# Patient Record
Sex: Female | Born: 1980
Health system: Southern US, Community
[De-identification: ages and names within clinical notes are randomized; demographics above are authoritative.]

## PROBLEM LIST (undated history)

## (undated) ENCOUNTER — Inpatient Hospital Stay (HOSPITAL_COMMUNITY): Payer: Self-pay

## (undated) DIAGNOSIS — A630 Anogenital (venereal) warts: Secondary | ICD-10-CM

## (undated) DIAGNOSIS — J45909 Unspecified asthma, uncomplicated: Secondary | ICD-10-CM

## (undated) DIAGNOSIS — O321XX0 Maternal care for breech presentation, not applicable or unspecified: Secondary | ICD-10-CM

## (undated) DIAGNOSIS — K589 Irritable bowel syndrome without diarrhea: Secondary | ICD-10-CM

## (undated) DIAGNOSIS — R87629 Unspecified abnormal cytological findings in specimens from vagina: Secondary | ICD-10-CM

## (undated) DIAGNOSIS — O24410 Gestational diabetes mellitus in pregnancy, diet controlled: Secondary | ICD-10-CM

## (undated) DIAGNOSIS — O24419 Gestational diabetes mellitus in pregnancy, unspecified control: Secondary | ICD-10-CM

## (undated) DIAGNOSIS — F419 Anxiety disorder, unspecified: Secondary | ICD-10-CM

## (undated) DIAGNOSIS — L309 Dermatitis, unspecified: Secondary | ICD-10-CM

## (undated) DIAGNOSIS — D219 Benign neoplasm of connective and other soft tissue, unspecified: Secondary | ICD-10-CM

## (undated) HISTORY — PX: WISDOM TOOTH EXTRACTION: SHX21

## (undated) HISTORY — DX: Anogenital (venereal) warts: A63.0

---

## 2011-09-24 ENCOUNTER — Emergency Department (HOSPITAL_COMMUNITY)
Admission: EM | Admit: 2011-09-24 | Discharge: 2011-09-24 | Disposition: A | Discharge status: Home / Self Care | Payer: PRIVATE HEALTH INSURANCE | Attending: Emergency Medicine | Admitting: Emergency Medicine

## 2011-09-24 DIAGNOSIS — X58XXXA Exposure to other specified factors, initial encounter: Secondary | ICD-10-CM | POA: Insufficient documentation

## 2011-09-24 DIAGNOSIS — R42 Dizziness and giddiness: Secondary | ICD-10-CM | POA: Insufficient documentation

## 2011-09-24 DIAGNOSIS — R5381 Other malaise: Secondary | ICD-10-CM | POA: Insufficient documentation

## 2011-09-24 DIAGNOSIS — R5383 Other fatigue: Secondary | ICD-10-CM | POA: Insufficient documentation

## 2011-09-24 DIAGNOSIS — T783XXA Angioneurotic edema, initial encounter: Secondary | ICD-10-CM | POA: Insufficient documentation

## 2011-12-29 DIAGNOSIS — A630 Anogenital (venereal) warts: Secondary | ICD-10-CM

## 2011-12-29 HISTORY — DX: Anogenital (venereal) warts: A63.0

## 2012-05-19 ENCOUNTER — Ambulatory Visit (INDEPENDENT_AMBULATORY_CARE_PROVIDER_SITE_OTHER): Payer: PRIVATE HEALTH INSURANCE | Admitting: Obstetrics & Gynecology

## 2012-05-19 ENCOUNTER — Encounter: Payer: Self-pay | Admitting: Obstetrics & Gynecology

## 2012-05-19 VITALS — BP 104/64 | HR 65 | Ht 59.0 in | Wt 130.0 lb

## 2012-05-19 DIAGNOSIS — Z3009 Encounter for other general counseling and advice on contraception: Secondary | ICD-10-CM

## 2012-05-19 DIAGNOSIS — Z30011 Encounter for initial prescription of contraceptive pills: Secondary | ICD-10-CM

## 2012-05-19 MED ORDER — NORETHIN-ETH ESTRAD-FE BIPHAS 1 MG-10 MCG / 10 MCG PO TABS: 1.0000 | ORAL_TABLET | Freq: Every day | ORAL | Status: DC

## 2012-05-19 NOTE — Patient Instructions (Signed)
Return to clinic for any scheduled appointments or for any gynecologic concerns as needed.   

## 2012-05-19 NOTE — Progress Notes (Signed)
Patient is here for birth control consult.  She was taking ortho tricyclen lo but it was costing her 100.00 a pack.

## 2012-05-19 NOTE — Progress Notes (Signed)
Ana Davis is a 31 y.o. G0 here today to discuss contraception. She denies any abnormal bleeding, discharge or other concerns. She is sexually active, was using Ortho Tricyclen Lo but it is too expensive.  Has tried regular OCPs but gets a lot of side effects; wants a low estrogen pill.  Reviewed all forms of reversible birth control options available including abstinence; over the counter methods; oral contraceptive pill, patch, or ring; Depo-Provera injection; Nexplanon; Mirena and Paragard IUDs. Risks and benefits reviewed. Questions were answered. She is interested in the Lo Loestrin pills; samples were given to the patient.    Normal pap smear last year as per patient.  Return to clinic as needed  Total counseling time: 30 minutes.

## 2012-08-18 ENCOUNTER — Ambulatory Visit: Payer: PRIVATE HEALTH INSURANCE | Admitting: Obstetrics & Gynecology

## 2013-08-18 ENCOUNTER — Ambulatory Visit (INDEPENDENT_AMBULATORY_CARE_PROVIDER_SITE_OTHER): Payer: BC Managed Care – PPO | Admitting: Obstetrics & Gynecology

## 2013-08-18 ENCOUNTER — Encounter: Payer: Self-pay | Admitting: Obstetrics & Gynecology

## 2013-08-18 VITALS — BP 130/79 | HR 60 | Ht 59.0 in | Wt 123.0 lb

## 2013-08-18 DIAGNOSIS — Z0000 Encounter for general adult medical examination without abnormal findings: Secondary | ICD-10-CM

## 2013-08-18 DIAGNOSIS — Z30011 Encounter for initial prescription of contraceptive pills: Secondary | ICD-10-CM

## 2013-08-18 DIAGNOSIS — Z124 Encounter for screening for malignant neoplasm of cervix: Secondary | ICD-10-CM

## 2013-08-18 DIAGNOSIS — Z01419 Encounter for gynecological examination (general) (routine) without abnormal findings: Secondary | ICD-10-CM

## 2013-08-18 DIAGNOSIS — Z3009 Encounter for other general counseling and advice on contraception: Secondary | ICD-10-CM

## 2013-08-18 DIAGNOSIS — Z113 Encounter for screening for infections with a predominantly sexual mode of transmission: Secondary | ICD-10-CM

## 2013-08-18 DIAGNOSIS — Z1151 Encounter for screening for human papillomavirus (HPV): Secondary | ICD-10-CM

## 2013-08-18 MED ORDER — NORETHIN-ETH ESTRAD-FE BIPHAS 1 MG-10 MCG / 10 MCG PO TABS: 1.0000 | ORAL_TABLET | Freq: Every day | ORAL | Status: DC

## 2013-08-18 NOTE — Progress Notes (Signed)
Patient ID: Ana Davis, female   DOB: 12-02-1981, 32 y.o.   MRN: [id number] Subjective:    Ana Davis is a 32 y.o. female who presents for an annual exam. The patient has no complaints today. The patient is sexually active. GYN screening history: last pap: was normal. The patient wears seatbelts: yes. The patient participates in regular exercise: yes. Has the patient ever been transfused or tattooed?: no. The patient reports that there is not domestic violence in her life.   Menstrual History: OB History   Grav Para Term Preterm Abortions TAB SAB Ect Mult Living                  Menarche age: 37 Coitarche: 61   Patient's last menstrual period was 08/09/2013.    The following portions of the patient's history were reviewed and updated as appropriate: allergies, current medications, past family history, past medical history, past social history, past surgical history and problem list.  Review of Systems A comprehensive review of systems was negative. 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 Single, monogamous for 2 years, lives with her parents currently. She did not have Gardasil. She uses condoms for birth control. She wants to restart OCPs.   Objective:    BP 130/79  Pulse 60  Ht 4\' 11"  (1.499 m)  Wt 123 lb (55.792 kg)  BMI 24.83 kg/m2  LMP 08/09/2013  General Appearance:    Alert, cooperative, no distress, appears stated age  Head:    Normocephalic, without obvious abnormality, atraumatic  Eyes:    PERRL, conjunctiva/corneas clear, EOM's intact, fundi    benign, both eyes  Ears:    Normal TM's and external ear canals, both ears  Nose:   Nares normal, septum midline, mucosa normal, no drainage    or sinus tenderness  Throat:   Lips, mucosa, and tongue normal; teeth and gums normal  Neck:   Supple, symmetrical, trachea midline, no adenopathy;    thyroid:  no enlargement/tenderness/nodules; no carotid   bruit or JVD  Back:     Symmetric, no  curvature, ROM normal, no CVA tenderness  Lungs:     Clear to auscultation bilaterally, respirations unlabored  Chest Wall:    No tenderness or deformity   Heart:    Regular rate and rhythm, S1 and S2 normal, no murmur, rub   or gallop  Breast Exam:    No tenderness, masses, or nipple abnormality  Abdomen:     Soft, non-tender, bowel sounds active all four quadrants,    no masses, no organomegaly  Genitalia:    Normal female without lesion, discharge or tenderness, cervix very anteverted, NSSR, NT, normal adnexal exam     Extremities:   Extremities normal, atraumatic, no cyanosis or edema  Pulses:   2+ and symmetric all extremities  Skin:   Skin color, texture, turgor normal, no rashes or lesions  Lymph nodes:   Cervical, supraclavicular, and axillary nodes normal  Neurologic:   CNII-XII intact, normal strength, sensation and reflexes    throughout  .    Assessment:    Healthy female exam.    Plan:     Breast self exam technique reviewed and patient encouraged to perform self-exam monthly. Thin prep Pap smear. with cotesting, cervical cultures

## 2013-08-21 ENCOUNTER — Telehealth: Payer: Self-pay | Admitting: *Deleted

## 2013-08-21 DIAGNOSIS — Z30011 Encounter for initial prescription of contraceptive pills: Secondary | ICD-10-CM

## 2013-08-21 DIAGNOSIS — Z3009 Encounter for other general counseling and advice on contraception: Secondary | ICD-10-CM

## 2013-08-21 MED ORDER — NORETHIN-ETH ESTRAD-FE BIPHAS 1 MG-10 MCG / 10 MCG PO TABS: 1.0000 | ORAL_TABLET | Freq: Every day | ORAL | Status: DC

## 2013-08-21 NOTE — Telephone Encounter (Signed)
rx resent to pharmacy, they said they did not receive the first transmission.

## 2013-08-21 NOTE — Progress Notes (Signed)
Patient called to say that Friday Ana Davis in [place] did not have her prescription.  She will check back with them today and see if they have it and call us back if for some reason the transmission did not go through.

## 2013-08-22 MED ORDER — NORGESTIMATE-ETH ESTRADIOL 0.25-35 MG-MCG PO TABS: 1.0000 | ORAL_TABLET | Freq: Every day | ORAL | Status: DC

## 2013-08-22 MED ORDER — NORETHIN-ETH ESTRAD-FE BIPHAS 1 MG-10 MCG / 10 MCG PO TABS: 1.0000 | ORAL_TABLET | Freq: Every day | ORAL | Status: DC

## 2013-08-22 NOTE — Addendum Note (Signed)
Addended by: Barbara Cower on: 08/22/2013 11:21 AM   Modules accepted: Orders

## 2013-08-22 NOTE — Progress Notes (Signed)
Patient called back again and the ocp we called in is not covered by her insurance and she would like something that is generic that they will cover.  Rx changed to orthocyclen with refills called to pharmacy.

## 2013-08-22 NOTE — Addendum Note (Signed)
Addended by: Barbara Cower on: 08/22/2013 03:02 PM   Modules accepted: Orders

## 2014-09-13 ENCOUNTER — Telehealth: Payer: Self-pay

## 2014-09-13 NOTE — Telephone Encounter (Signed)
Patient called regarding her birth control. She is out and has no refills. We gave her a pack with no refills and made her an appointment to come in for her annual on 10/02/14 and get a new prescription.

## 2014-10-02 ENCOUNTER — Encounter: Payer: Self-pay | Admitting: Family Medicine

## 2014-10-02 ENCOUNTER — Ambulatory Visit (INDEPENDENT_AMBULATORY_CARE_PROVIDER_SITE_OTHER): Payer: 59 | Admitting: Family Medicine

## 2014-10-02 VITALS — BP 126/75 | HR 75 | Ht 59.0 in | Wt 130.0 lb

## 2014-10-02 DIAGNOSIS — Z01419 Encounter for gynecological examination (general) (routine) without abnormal findings: Secondary | ICD-10-CM

## 2014-10-02 DIAGNOSIS — Z3009 Encounter for other general counseling and advice on contraception: Secondary | ICD-10-CM

## 2014-10-02 DIAGNOSIS — Z309 Encounter for contraceptive management, unspecified: Secondary | ICD-10-CM

## 2014-10-02 DIAGNOSIS — Z124 Encounter for screening for malignant neoplasm of cervix: Secondary | ICD-10-CM

## 2014-10-02 DIAGNOSIS — Z1151 Encounter for screening for human papillomavirus (HPV): Secondary | ICD-10-CM

## 2014-10-02 DIAGNOSIS — Z30011 Encounter for initial prescription of contraceptive pills: Secondary | ICD-10-CM

## 2014-10-02 DIAGNOSIS — Z0000 Encounter for general adult medical examination without abnormal findings: Secondary | ICD-10-CM

## 2014-10-02 LAB — CBC
HEMATOCRIT: 36.4 % (ref 36.0–46.0)
Hemoglobin: 12.2 g/dL (ref 12.0–15.0)
MCH: 31 pg (ref 26.0–34.0)
MCHC: 33.5 g/dL (ref 30.0–36.0)
MCV: 92.6 fL (ref 78.0–100.0)
Platelets: 360 10*3/uL (ref 150–400)
RBC: 3.93 MIL/uL (ref 3.87–5.11)
RDW: 13.1 % (ref 11.5–15.5)
WBC: 5.6 10*3/uL (ref 4.0–10.5)

## 2014-10-02 MED ORDER — NORETHIN-ETH ESTRAD-FE BIPHAS 1 MG-10 MCG / 10 MCG PO TABS
1.0000 | ORAL_TABLET | Freq: Every day | ORAL | Status: DC
Start: 2014-10-02 — End: 2015-09-13

## 2014-10-02 NOTE — Progress Notes (Signed)
Subjective:     Ana Davis is a 33 y.o. female and is here for a comprehensive physical exam. The patient reports no problems. Recently married. On Lo Loestrin and continuing that---may decide to try to conceive next year.  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   History   Social History  . Marital Status: Single    Spouse Name: N/A    Number of Children: N/A  . Years of Education: N/A   Occupational History  . Not on file.   Social History Main Topics  . Smoking status: Never Smoker   . Smokeless tobacco: Never Used  . Alcohol Use: Yes     Comment: social  . Drug Use: No  . Sexual Activity: Yes    Partners: Male    Birth Control/ Protection: OCP   Other Topics Concern  . Not on file   Social History Narrative  . No narrative on file   Health Maintenance  Topic Date Due  . Tetanus/tdap  05/18/1999  . Influenza Vaccine  07/28/2014  . Pap Smear  08/18/2016    The following portions of the patient's history were reviewed and updated as appropriate: allergies, current medications, past family history, past medical history, past social history, past surgical history and problem list.  Review of Systems A comprehensive review of systems was negative.   Objective:    BP 126/75  Pulse 75  Ht 4\' 11"  (1.499 m)  Wt 130 lb (58.968 kg)  BMI 26.24 kg/m2  LMP 09/12/2014 General appearance: alert and cooperative Head: Normocephalic, without obvious abnormality, atraumatic Neck: no adenopathy, supple, symmetrical, trachea midline and thyroid not enlarged, symmetric, no tenderness/mass/nodules Lungs: clear to auscultation bilaterally Breasts: normal appearance, no masses or tenderness Heart: regular rate and rhythm, S1, S2 normal, no murmur, click, rub or gallop Abdomen: soft, non-tender; bowel sounds normal; no masses,  no organomegaly Pelvic: cervix normal in appearance, external genitalia normal, no adnexal masses or tenderness, no cervical motion tenderness, uterus normal  size, shape, and consistency and vagina normal without discharge Extremities: extremities normal, atraumatic, no cyanosis or edema Pulses: 2+ and symmetric Skin: Skin color, texture, turgor normal. No rashes or lesions Lymph nodes: Cervical, supraclavicular, and axillary nodes normal. Neurologic: Grossly normal    Assessment:    Healthy female exam.      Plan:      Problem List Items Addressed This Visit   None    Visit Diagnoses   Encounter for routine gynecological examination    -  Primary    Relevant Orders       CBC       Comprehensive metabolic panel       TSH       Lipid panel    OCP (oral contraceptive pills) initiation        Relevant Medications       Norethindrone-Ethinyl Estradiol-Fe Biphas (LO LOESTRIN FE) 1 MG-10 MCG / 10 MCG tablet    Encounter for other general counseling or advice on contraception        Relevant Medications       Norethindrone-Ethinyl Estradiol-Fe Biphas (LO LOESTRIN FE) 1 MG-10 MCG / 10 MCG tablet    Annual physical exam        Relevant Medications       Norethindrone-Ethinyl Estradiol-Fe Biphas (LO LOESTRIN FE) 1 MG-10 MCG / 10 MCG tablet    Screening for malignant neoplasm of cervix        Relevant Orders  Cytology - PAP       See After Visit Summary for Counseling Recommendations

## 2014-10-02 NOTE — Patient Instructions (Signed)
Preparing for Pregnancy Before trying to become pregnant, make an appointment with your health care provider (preconception care). The goal is to help you have a healthy, safe pregnancy. At your first appointment, your health care provider will:   Do a complete physical exam, including a Pap test.  Take a complete medical history.  Give you advice and help you resolve any problems. PRECONCEPTION CHECKLIST Here is a list of the basics to cover with your health care provider at your preconception visit:  Medical history.  Tell your health care provider about any diseases you have had. Many diseases can affect your pregnancy.  Include your partner's medical history and family history.  Make sure you have been tested for sexually transmitted infections (STIs). These can affect your pregnancy. In some cases, they can be passed to your baby. Tell your health care provider about any history of STIs.  Make sure your health care provider knows about any previous problems you have had with conception or pregnancy.  Tell your health care provider about any medicine you take. This includes herbal supplements and over-the-counter medicines.  Make sure all your immunizations are up to date. You may need to make additional appointments.  Ask your health care provider if you need any vaccinations or if there are any you should avoid.  Diet.  It is especially important to eat a healthy, balanced diet with the right nutrients when you are pregnant.  Ask your health care provider to help you get to a healthy weight before pregnancy.  If you are overweight, you are at higher risk for certain complications. These include high blood pressure, diabetes, and preterm birth.  If you are underweight, you are more likely to have a low-birth-weight baby.  Lifestyle.  Tell your health care provider about lifestyle factors such as alcohol use, drug use, or smoking.  Describe any harmful substances you may  be exposed to at work or home. These can include chemicals, pesticides, and radiation.  Mental health.  Let your health care provider know if you have been feeling depressed or anxious.  Let your health care provider know if you have a history of substance abuse.  Let your health care provider know if you do not feel safe at home. HOME INSTRUCTIONS TO PREPARE FOR PREGNANCY Follow your health care provider's advice and instructions.   Keep an accurate record of your menstrual periods. This makes it easier for your health care provider to determine your baby's due date.  Begin taking prenatal vitamins and folic acid supplements daily. Take them as directed by your health care provider.  Eat a balanced diet. Get help from a nutrition counselor if you have questions or need help.  Get regular exercise. Try to be active for at least 30 minutes a day most days of the week.  Quit smoking, if you smoke.  Do not drink alcohol.  Do not take illegal drugs.  Get medical problems, such as diabetes or high blood pressure, under control.  If you have diabetes, make sure you do the following:  Have good blood sugar control. If you have type 1 diabetes, use multiple daily doses of insulin. Do not use split-dose or premixed insulin.  Have an eye exam by a qualified eye care professional trained in caring for people with diabetes.  Get evaluated by your health care provider for cardiovascular disease.  Get to a healthy weight. If you are overweight or obese, reduce your weight with the help of a qualified health   professional such as a registered dietitian. Ask your health care provider what the right weight range is for you. HOW DO I KNOW I AM PREGNANT? You may be pregnant if you have been sexually active and you miss your period. Symptoms of early pregnancy include:   Mild cramping.  Very light vaginal bleeding (spotting).  Feeling unusually tired.  Morning sickness. If you have any of  these symptoms, take a home pregnancy test. These tests look for a hormone called human chorionic gonadotropin (hCG) in your urine. Your body begins to make this hormone during early pregnancy. These tests are very accurate. Wait until at least the first day you miss your period to take one. If you get a positive result, call your health care provider to make appointments for prenatal care. WHAT SHOULD I DO IF I BECOME PREGNANT?  Make an appointment with your health care provider by week 12 of your pregnancy at the latest.  Do not smoke. Smoking can be harmful to your baby.  Do not drink alcoholic beverages. Alcohol is related to a number of birth defects.  Avoid toxic odors and chemicals.  You may continue to have sexual intercourse if it does not cause pain or other problems, such as vaginal bleeding. Document Released: 11/26/2008 Document Revised: 04/30/2014 Document Reviewed: 11/20/2013 [employer] Patient Information 2015 [employer]. This information is not intended to replace advice given to you by your health care provider. Make sure you discuss any questions you have with your health care provider. Preventive Care for Adults A healthy lifestyle and preventive care can promote health and wellness. Preventive health guidelines for women include the following key practices.  A routine yearly physical is a good way to check with your health care provider about your health and preventive screening. It is a chance to share any concerns and updates on your health and to receive a thorough exam.  Visit your dentist for a routine exam and preventive care every 6 months. Brush your teeth twice a day and floss once a day. Good oral hygiene prevents tooth decay and gum disease.  The frequency of eye exams is based on your age, health, family medical history, use of contact lenses, and other factors. Follow your health care provider's recommendations for frequency of eye exams.  Eat a healthy  diet. Foods like vegetables, fruits, whole grains, low-fat dairy products, and lean protein foods contain the nutrients you need without too many calories. Decrease your intake of foods high in solid fats, added sugars, and salt. Eat the right amount of calories for you.Get information about a proper diet from your health care provider, if necessary.  Regular physical exercise is one of the most important things you can do for your health. Most adults should get at least 150 minutes of moderate-intensity exercise (any activity that increases your heart rate and causes you to sweat) each week. In addition, most adults need muscle-strengthening exercises on 2 or more days a week.  Maintain a healthy weight. The body mass index (BMI) is a screening tool to identify possible weight problems. It provides an estimate of body fat based on height and weight. Your health care provider can find your BMI and can help you achieve or maintain a healthy weight.For adults 20 years and older:  A BMI below 18.5 is considered underweight.  A BMI of 18.5 to 24.9 is normal.  A BMI of 25 to 29.9 is considered overweight.  A BMI of 30 and above is considered obese.    Maintain normal blood lipids and cholesterol levels by exercising and minimizing your intake of saturated fat. Eat a balanced diet with plenty of fruit and vegetables. Blood tests for lipids and cholesterol should begin at age 20 and be repeated every 5 years. If your lipid or cholesterol levels are high, you are over 50, or you are at high risk for heart disease, you may need your cholesterol levels checked more frequently.Ongoing high lipid and cholesterol levels should be treated with medicines if diet and exercise are not working.  If you smoke, find out from your health care provider how to quit. If you do not use tobacco, do not start.  Lung cancer screening is recommended for adults aged 55-80 years who are at high risk for developing lung cancer  because of a history of smoking. A yearly low-dose CT scan of the lungs is recommended for people who have at least a 30-pack-year history of smoking and are a current smoker or have quit within the past 15 years. A pack year of smoking is smoking an average of 1 pack of cigarettes a day for 1 year (for example: 1 pack a day for 30 years or 2 packs a day for 15 years). Yearly screening should continue until the smoker has stopped smoking for at least 15 years. Yearly screening should be stopped for people who develop a health problem that would prevent them from having lung cancer treatment.  If you are pregnant, do not drink alcohol. If you are breastfeeding, be very cautious about drinking alcohol. If you are not pregnant and choose to drink alcohol, do not have more than 1 drink per day. One drink is considered to be 12 ounces (355 mL) of beer, 5 ounces (148 mL) of wine, or 1.5 ounces (44 mL) of liquor.  Avoid use of street drugs. Do not share needles with anyone. Ask for help if you need support or instructions about stopping the use of drugs.  High blood pressure causes heart disease and increases the risk of stroke. Your blood pressure should be checked at least every 1 to 2 years. Ongoing high blood pressure should be treated with medicines if weight loss and exercise do not work.  If you are 55-79 years old, ask your health care provider if you should take aspirin to prevent strokes.  Diabetes screening involves taking a blood sample to check your fasting blood sugar level. This should be done once every 3 years, after age 45, if you are within normal weight and without risk factors for diabetes. Testing should be considered at a younger age or be carried out more frequently if you are overweight and have at least 1 risk factor for diabetes.  Breast cancer screening is essential preventive care for women. You should practice "breast self-awareness." This means understanding the normal appearance  and feel of your breasts and may include breast self-examination. Any changes detected, no matter how small, should be reported to a health care provider. Women in their 20s and 30s should have a clinical breast exam (CBE) by a health care provider as part of a regular health exam every 1 to 3 years. After age 40, women should have a CBE every year. Starting at age 40, women should consider having a mammogram (breast X-ray test) every year. Women who have a family history of breast cancer should talk to their health care provider about genetic screening. Women at a high risk of breast cancer should talk to their health care providers   about having an MRI and a mammogram every year.  Breast cancer gene (BRCA)-related cancer risk assessment is recommended for women who have family members with BRCA-related cancers. BRCA-related cancers include breast, ovarian, tubal, and peritoneal cancers. Having family members with these cancers may be associated with an increased risk for harmful changes (mutations) in the breast cancer genes BRCA1 and BRCA2. Results of the assessment will determine the need for genetic counseling and BRCA1 and BRCA2 testing.  Routine pelvic exams to screen for cancer are no longer recommended for nonpregnant women who are considered low risk for cancer of the pelvic organs (ovaries, uterus, and vagina) and who do not have symptoms. Ask your health care provider if a screening pelvic exam is right for you.  If you have had past treatment for cervical cancer or a condition that could lead to cancer, you need Pap tests and screening for cancer for at least 20 years after your treatment. If Pap tests have been discontinued, your risk factors (such as having a new sexual partner) need to be reassessed to determine if screening should be resumed. Some women have medical problems that increase the chance of getting cervical cancer. In these cases, your health care provider may recommend more  frequent screening and Pap tests.  The HPV test is an additional test that may be used for cervical cancer screening. The HPV test looks for the virus that can cause the cell changes on the cervix. The cells collected during the Pap test can be tested for HPV. The HPV test could be used to screen women aged 54 years and older, and should be used in women of any age who have unclear Pap test results. After the age of 39, women should have HPV testing at the same frequency as a Pap test.  Colorectal cancer can be detected and often prevented. Most routine colorectal cancer screening begins at the age of 36 years and continues through age 74 years. However, your health care provider may recommend screening at an earlier age if you have risk factors for colon cancer. On a yearly basis, your health care provider may provide home test kits to check for hidden blood in the stool. Use of a small camera at the end of a tube, to directly examine the colon (sigmoidoscopy or colonoscopy), can detect the earliest forms of colorectal cancer. Talk to your health care provider about this at age 35, when routine screening begins. Direct exam of the colon should be repeated every 5-10 years through age 66 years, unless early forms of pre-cancerous polyps or small growths are found.  People who are at an increased risk for hepatitis B should be screened for this virus. You are considered at high risk for hepatitis B if:  You were born in a country where hepatitis B occurs often. Talk with your health care provider about which countries are considered high risk.  Your parents were born in a high-risk country and you have not received a shot to protect against hepatitis B (hepatitis B vaccine).  You have HIV or AIDS.  You use needles to inject street drugs.  You live with, or have sex with, someone who has hepatitis B.  You get hemodialysis treatment.  You take certain medicines for conditions like cancer, organ  transplantation, and autoimmune conditions.  Hepatitis C blood testing is recommended for all people born from [birth year] through 1966 and any individual with known risks for hepatitis C.  Practice safe sex. Use condoms and avoid  high-risk sexual practices to reduce the spread of sexually transmitted infections (STIs). STIs include gonorrhea, chlamydia, syphilis, trichomonas, herpes, HPV, and human immunodeficiency virus (HIV). Herpes, HIV, and HPV are viral illnesses that have no cure. They can result in disability, cancer, and death.  You should be screened for sexually transmitted illnesses (STIs) including gonorrhea and chlamydia if:  You are sexually active and are younger than 24 years.  You are older than 24 years and your health care provider tells you that you are at risk for this type of infection.  Your sexual activity has changed since you were last screened and you are at an increased risk for chlamydia or gonorrhea. Ask your health care provider if you are at risk.  If you are at risk of being infected with HIV, it is recommended that you take a prescription medicine daily to prevent HIV infection. This is called preexposure prophylaxis (PrEP). You are considered at risk if:  You are a heterosexual woman, are sexually active, and are at increased risk for HIV infection.  You take drugs by injection.  You are sexually active with a partner who has HIV.  Talk with your health care provider about whether you are at high risk of being infected with HIV. If you choose to begin PrEP, you should first be tested for HIV. You should then be tested every 3 months for as long as you are taking PrEP.  Osteoporosis is a disease in which the bones lose minerals and strength with aging. This can result in serious bone fractures or breaks. The risk of osteoporosis can be identified using a bone density scan. Women ages 65 years and over and women at risk for fractures or osteoporosis should discuss  screening with their health care providers. Ask your health care provider whether you should take a calcium supplement or vitamin D to reduce the rate of osteoporosis.  Menopause can be associated with physical symptoms and risks. Hormone replacement therapy is available to decrease symptoms and risks. You should talk to your health care provider about whether hormone replacement therapy is right for you.  Use sunscreen. Apply sunscreen liberally and repeatedly throughout the day. You should seek shade when your shadow is shorter than you. Protect yourself by wearing long sleeves, pants, a wide-brimmed hat, and sunglasses year round, whenever you are outdoors.  Once a month, do a whole body skin exam, using a mirror to look at the skin on your back. Tell your health care provider of new moles, moles that have irregular borders, moles that are larger than a pencil eraser, or moles that have changed in shape or color.  Stay current with required vaccines (immunizations).  Influenza vaccine. All adults should be immunized every year.  Tetanus, diphtheria, and acellular pertussis (Td, Tdap) vaccine. Pregnant women should receive 1 dose of Tdap vaccine during each pregnancy. The dose should be obtained regardless of the length of time since the last dose. Immunization is preferred during the 27th-36th week of gestation. An adult who has not previously received Tdap or who does not know her vaccine status should receive 1 dose of Tdap. This initial dose should be followed by tetanus and diphtheria toxoids (Td) booster doses every 10 years. Adults with an unknown or incomplete history of completing a 3-dose immunization series with Td-containing vaccines should begin or complete a primary immunization series including a Tdap dose. Adults should receive a Td booster every 10 years.  Varicella vaccine. An adult without evidence of immunity   to varicella should receive 2 doses or a second dose if she has  previously received 1 dose. Pregnant females who do not have evidence of immunity should receive the first dose after pregnancy. This first dose should be obtained before leaving the health care facility. The second dose should be obtained 4-8 weeks after the first dose.  Human papillomavirus (HPV) vaccine. Females aged 13-26 years who have not received the vaccine previously should obtain the 3-dose series. The vaccine is not recommended for use in pregnant females. However, pregnancy testing is not needed before receiving a dose. If a female is found to be pregnant after receiving a dose, no treatment is needed. In that case, the remaining doses should be delayed until after the pregnancy. Immunization is recommended for any person with an immunocompromised condition through the age of 26 years if she did not get any or all doses earlier. During the 3-dose series, the second dose should be obtained 4-8 weeks after the first dose. The third dose should be obtained 24 weeks after the first dose and 16 weeks after the second dose.  Zoster vaccine. One dose is recommended for adults aged 60 years or older unless certain conditions are present.  Measles, mumps, and rubella (MMR) vaccine. Adults born before 1957 generally are considered immune to measles and mumps. Adults born in 1957 or later should have 1 or more doses of MMR vaccine unless there is a contraindication to the vaccine or there is laboratory evidence of immunity to each of the three diseases. A routine second dose of MMR vaccine should be obtained at least 28 days after the first dose for students attending postsecondary schools, health care workers, or international travelers. People who received inactivated measles vaccine or an unknown type of measles vaccine during 1963-1967 should receive 2 doses of MMR vaccine. People who received inactivated mumps vaccine or an unknown type of mumps vaccine before 1979 and are at high risk for mumps  infection should consider immunization with 2 doses of MMR vaccine. For females of childbearing age, rubella immunity should be determined. If there is no evidence of immunity, females who are not pregnant should be vaccinated. If there is no evidence of immunity, females who are pregnant should delay immunization until after pregnancy. Unvaccinated health care workers born before 1957 who lack laboratory evidence of measles, mumps, or rubella immunity or laboratory confirmation of disease should consider measles and mumps immunization with 2 doses of MMR vaccine or rubella immunization with 1 dose of MMR vaccine.  Pneumococcal 13-valent conjugate (PCV13) vaccine. When indicated, a person who is uncertain of her immunization history and has no record of immunization should receive the PCV13 vaccine. An adult aged 19 years or older who has certain medical conditions and has not been previously immunized should receive 1 dose of PCV13 vaccine. This PCV13 should be followed with a dose of pneumococcal polysaccharide (PPSV23) vaccine. The PPSV23 vaccine dose should be obtained at least 8 weeks after the dose of PCV13 vaccine. An adult aged 19 years or older who has certain medical conditions and previously received 1 or more doses of PPSV23 vaccine should receive 1 dose of PCV13. The PCV13 vaccine dose should be obtained 1 or more years after the last PPSV23 vaccine dose.  Pneumococcal polysaccharide (PPSV23) vaccine. When PCV13 is also indicated, PCV13 should be obtained first. All adults aged 65 years and older should be immunized. An adult younger than age 65 years who has certain medical conditions should be immunized.   Any person who resides in a nursing home or long-term care facility should be immunized. An adult smoker should be immunized. People with an immunocompromised condition and certain other conditions should receive both PCV13 and PPSV23 vaccines. People with human immunodeficiency virus (HIV)  infection should be immunized as soon as possible after diagnosis. Immunization during chemotherapy or radiation therapy should be avoided. Routine use of PPSV23 vaccine is not recommended for American Indians, [place] Natives, or people younger than 65 years unless there are medical conditions that require PPSV23 vaccine. When indicated, people who have unknown immunization and have no record of immunization should receive PPSV23 vaccine. One-time revaccination 5 years after the first dose of PPSV23 is recommended for people aged 19-64 years who have chronic kidney failure, nephrotic syndrome, asplenia, or immunocompromised conditions. People who received 1-2 doses of PPSV23 before age 65 years should receive another dose of PPSV23 vaccine at age 65 years or later if at least 5 years have passed since the previous dose. Doses of PPSV23 are not needed for people immunized with PPSV23 at or after age 65 years.  Meningococcal vaccine. Adults with asplenia or persistent complement component deficiencies should receive 2 doses of quadrivalent meningococcal conjugate (MenACWY-D) vaccine. The doses should be obtained at least 2 months apart. Microbiologists working with certain meningococcal bacteria, military recruits, people at risk during an outbreak, and people who travel to or live in countries with a high rate of meningitis should be immunized. A first-year college student up through age 21 years who is living in a residence hall should receive a dose if she did not receive a dose on or after her 16th birthday. Adults who have certain high-risk conditions should receive one or more doses of vaccine.  Hepatitis A vaccine. Adults who wish to be protected from this disease, have certain high-risk conditions, work with hepatitis A-infected animals, work in hepatitis A research labs, or travel to or work in countries with a high rate of hepatitis A should be immunized. Adults who were previously unvaccinated and who  anticipate close contact with an international adoptee during the first 60 days after arrival in the [place] from a country with a high rate of hepatitis A should be immunized.  Hepatitis B vaccine. Adults who wish to be protected from this disease, have certain high-risk conditions, may be exposed to blood or other infectious body fluids, are household contacts or sex partners of hepatitis B positive people, are clients or workers in certain care facilities, or travel to or work in countries with a high rate of hepatitis B should be immunized.  Haemophilus influenzae type b (Hib) vaccine. A previously unvaccinated person with asplenia or sickle cell disease or having a scheduled splenectomy should receive 1 dose of Hib vaccine. Regardless of previous immunization, a recipient of a hematopoietic stem cell transplant should receive a 3-dose series 6-12 months after her successful transplant. Hib vaccine is not recommended for adults with HIV infection. Preventive Services / Frequency Ages 19 to 39 years  Blood pressure check.** / Every 1 to 2 years.  Lipid and cholesterol check.** / Every 5 years beginning at age 20.  Clinical breast exam.** / Every 3 years for women in their 20s and 30s.  BRCA-related cancer risk assessment.** / For women who have family members with a BRCA-related cancer (breast, ovarian, tubal, or peritoneal cancers).  Pap test.** / Every 2 years from ages 21 through 29. Every 3 years starting at age 30 through age 65 or 70   with a history of 3 consecutive normal Pap tests.  HPV screening.** / Every 3 years from ages 30 through ages 65 to 70 with a history of 3 consecutive normal Pap tests.  Hepatitis C blood test.** / For any individual with known risks for hepatitis C.  Skin self-exam. / Monthly.  Influenza vaccine. / Every year.  Tetanus, diphtheria, and acellular pertussis (Tdap, Td) vaccine.** / Consult your health care provider. Pregnant women should receive 1  dose of Tdap vaccine during each pregnancy. 1 dose of Td every 10 years.  Varicella vaccine.** / Consult your health care provider. Pregnant females who do not have evidence of immunity should receive the first dose after pregnancy.  HPV vaccine. / 3 doses over 6 months, if 26 and younger. The vaccine is not recommended for use in pregnant females. However, pregnancy testing is not needed before receiving a dose.  Measles, mumps, rubella (MMR) vaccine.** / You need at least 1 dose of MMR if you were born in 1957 or later. You may also need a 2nd dose. For females of childbearing age, rubella immunity should be determined. If there is no evidence of immunity, females who are not pregnant should be vaccinated. If there is no evidence of immunity, females who are pregnant should delay immunization until after pregnancy.  Pneumococcal 13-valent conjugate (PCV13) vaccine.** / Consult your health care provider.  Pneumococcal polysaccharide (PPSV23) vaccine.** / 1 to 2 doses if you smoke cigarettes or if you have certain conditions.  Meningococcal vaccine.** / 1 dose if you are age 19 to 21 years and a first-year college student living in a residence hall, or have one of several medical conditions, you need to get vaccinated against meningococcal disease. You may also need additional booster doses.  Hepatitis A vaccine.** / Consult your health care provider.  Hepatitis B vaccine.** / Consult your health care provider.  Haemophilus influenzae type b (Hib) vaccine.** / Consult your health care provider. Ages 40 to 64 years  Blood pressure check.** / Every 1 to 2 years.  Lipid and cholesterol check.** / Every 5 years beginning at age 20 years.  Lung cancer screening. / Every year if you are aged 55-80 years and have a 30-pack-year history of smoking and currently smoke or have quit within the past 15 years. Yearly screening is stopped once you have quit smoking for at least 15 years or develop a  health problem that would prevent you from having lung cancer treatment.  Clinical breast exam.** / Every year after age 40 years.  BRCA-related cancer risk assessment.** / For women who have family members with a BRCA-related cancer (breast, ovarian, tubal, or peritoneal cancers).  Mammogram.** / Every year beginning at age 40 years and continuing for as long as you are in good health. Consult with your health care provider.  Pap test.** / Every 3 years starting at age 30 years through age 65 or 70 years with a history of 3 consecutive normal Pap tests.  HPV screening.** / Every 3 years from ages 30 years through ages 65 to 70 years with a history of 3 consecutive normal Pap tests.  Fecal occult blood test (FOBT) of stool. / Every year beginning at age 50 years and continuing until age 75 years. You may not need to do this test if you get a colonoscopy every 10 years.  Flexible sigmoidoscopy or colonoscopy.** / Every 5 years for a flexible sigmoidoscopy or every 10 years for a colonoscopy beginning at age 50 years   and continuing until age 75 years.  Hepatitis C blood test.** / For all people born from 1945 through 1965 and any individual with known risks for hepatitis C.  Skin self-exam. / Monthly.  Influenza vaccine. / Every year.  Tetanus, diphtheria, and acellular pertussis (Tdap/Td) vaccine.** / Consult your health care provider. Pregnant women should receive 1 dose of Tdap vaccine during each pregnancy. 1 dose of Td every 10 years.  Varicella vaccine.** / Consult your health care provider. Pregnant females who do not have evidence of immunity should receive the first dose after pregnancy.  Zoster vaccine.** / 1 dose for adults aged 60 years or older.  Measles, mumps, rubella (MMR) vaccine.** / You need at least 1 dose of MMR if you were born in 1957 or later. You may also need a 2nd dose. For females of childbearing age, rubella immunity should be determined. If there is no  evidence of immunity, females who are not pregnant should be vaccinated. If there is no evidence of immunity, females who are pregnant should delay immunization until after pregnancy.  Pneumococcal 13-valent conjugate (PCV13) vaccine.** / Consult your health care provider.  Pneumococcal polysaccharide (PPSV23) vaccine.** / 1 to 2 doses if you smoke cigarettes or if you have certain conditions.  Meningococcal vaccine.** / Consult your health care provider.  Hepatitis A vaccine.** / Consult your health care provider.  Hepatitis B vaccine.** / Consult your health care provider.  Haemophilus influenzae type b (Hib) vaccine.** / Consult your health care provider. Ages 65 years and over  Blood pressure check.** / Every 1 to 2 years.  Lipid and cholesterol check.** / Every 5 years beginning at age 20 years.  Lung cancer screening. / Every year if you are aged 55-80 years and have a 30-pack-year history of smoking and currently smoke or have quit within the past 15 years. Yearly screening is stopped once you have quit smoking for at least 15 years or develop a health problem that would prevent you from having lung cancer treatment.  Clinical breast exam.** / Every year after age 40 years.  BRCA-related cancer risk assessment.** / For women who have family members with a BRCA-related cancer (breast, ovarian, tubal, or peritoneal cancers).  Mammogram.** / Every year beginning at age 40 years and continuing for as long as you are in good health. Consult with your health care provider.  Pap test.** / Every 3 years starting at age 30 years through age 65 or 70 years with 3 consecutive normal Pap tests. Testing can be stopped between 65 and 70 years with 3 consecutive normal Pap tests and no abnormal Pap or HPV tests in the past 10 years.  HPV screening.** / Every 3 years from ages 30 years through ages 65 or 70 years with a history of 3 consecutive normal Pap tests. Testing can be stopped between 65  and 70 years with 3 consecutive normal Pap tests and no abnormal Pap or HPV tests in the past 10 years.  Fecal occult blood test (FOBT) of stool. / Every year beginning at age 50 years and continuing until age 75 years. You may not need to do this test if you get a colonoscopy every 10 years.  Flexible sigmoidoscopy or colonoscopy.** / Every 5 years for a flexible sigmoidoscopy or every 10 years for a colonoscopy beginning at age 50 years and continuing until age 75 years.  Hepatitis C blood test.** / For all people born from 1945 through 1965 and any individual with known risks   for hepatitis C.  Osteoporosis screening.** / A one-time screening for women ages 65 years and over and women at risk for fractures or osteoporosis.  Skin self-exam. / Monthly.  Influenza vaccine. / Every year.  Tetanus, diphtheria, and acellular pertussis (Tdap/Td) vaccine.** / 1 dose of Td every 10 years.  Varicella vaccine.** / Consult your health care provider.  Zoster vaccine.** / 1 dose for adults aged 60 years or older.  Pneumococcal 13-valent conjugate (PCV13) vaccine.** / Consult your health care provider.  Pneumococcal polysaccharide (PPSV23) vaccine.** / 1 dose for all adults aged 65 years and older.  Meningococcal vaccine.** / Consult your health care provider.  Hepatitis A vaccine.** / Consult your health care provider.  Hepatitis B vaccine.** / Consult your health care provider.  Haemophilus influenzae type b (Hib) vaccine.** / Consult your health care provider. ** Family history and personal history of risk and conditions may change your health care provider's recommendations. Document Released: 02/09/2002 Document Revised: 04/30/2014 Document Reviewed: 05/11/2011 [employer] Patient Information 2015 [employer]. This information is not intended to replace advice given to you by your health care provider. Make sure you discuss any questions you have with your health care provider.  

## 2014-10-03 LAB — COMPREHENSIVE METABOLIC PANEL
ALK PHOS: 46 U/L (ref 39–117)
ALT: 11 U/L (ref 0–35)
AST: 13 U/L (ref 0–37)
Albumin: 3.6 g/dL (ref 3.5–5.2)
BUN: 16 mg/dL (ref 6–23)
CALCIUM: 8.7 mg/dL (ref 8.4–10.5)
CHLORIDE: 105 meq/L (ref 96–112)
CO2: 27 mEq/L (ref 19–32)
CREATININE: 0.8 mg/dL (ref 0.50–1.10)
Glucose, Bld: 100 mg/dL — ABNORMAL HIGH (ref 70–99)
Potassium: 3.8 mEq/L (ref 3.5–5.3)
Sodium: 139 mEq/L (ref 135–145)
Total Bilirubin: 0.3 mg/dL (ref 0.2–1.2)
Total Protein: 6.5 g/dL (ref 6.0–8.3)

## 2014-10-03 LAB — LIPID PANEL
CHOLESTEROL: 123 mg/dL (ref 0–200)
HDL: 52 mg/dL (ref >39)
LDL Cholesterol: 52 mg/dL (ref 0–99)
Total CHOL/HDL Ratio: 2.4 Ratio
Triglycerides: 93 mg/dL (ref <150)
VLDL: 19 mg/dL (ref 0–40)

## 2014-10-03 LAB — TSH: TSH: 0.885 u[IU]/mL (ref 0.350–4.500)

## 2014-10-03 LAB — CYTOLOGY - PAP

## 2014-10-04 ENCOUNTER — Encounter: Payer: Self-pay | Admitting: Family Medicine

## 2014-10-04 DIAGNOSIS — R87810 Cervical high risk human papillomavirus (HPV) DNA test positive: Secondary | ICD-10-CM | POA: Insufficient documentation

## 2015-05-30 ENCOUNTER — Emergency Department (HOSPITAL_COMMUNITY)
Admission: EM | Admit: 2015-05-30 | Discharge: 2015-05-30 | Disposition: A | Discharge status: Home / Self Care | Payer: 59 | Attending: Emergency Medicine | Admitting: Emergency Medicine

## 2015-05-30 ENCOUNTER — Encounter (HOSPITAL_COMMUNITY): Payer: Self-pay | Admitting: *Deleted

## 2015-05-30 DIAGNOSIS — J45901 Unspecified asthma with (acute) exacerbation: Secondary | ICD-10-CM | POA: Diagnosis not present

## 2015-05-30 DIAGNOSIS — Z79899 Other long term (current) drug therapy: Secondary | ICD-10-CM | POA: Diagnosis not present

## 2015-05-30 DIAGNOSIS — J705 Respiratory conditions due to smoke inhalation: Secondary | ICD-10-CM | POA: Insufficient documentation

## 2015-05-30 DIAGNOSIS — Z8619 Personal history of other infectious and parasitic diseases: Secondary | ICD-10-CM | POA: Insufficient documentation

## 2015-05-30 DIAGNOSIS — R0602 Shortness of breath: Secondary | ICD-10-CM | POA: Diagnosis present

## 2015-05-30 DIAGNOSIS — T59811A Toxic effect of smoke, accidental (unintentional), initial encounter: Secondary | ICD-10-CM

## 2015-05-30 HISTORY — DX: Unspecified asthma, uncomplicated: J45.909

## 2015-05-30 LAB — CARBOXYHEMOGLOBIN
Carboxyhemoglobin: 1.1 % (ref 0.5–1.5)
Methemoglobin: 1.3 % (ref 0.0–1.5)
O2 Saturation: 99 %
Total hemoglobin: 11.6 g/dL — ABNORMAL LOW (ref 12.0–16.0)

## 2015-05-30 MED ORDER — ALBUTEROL SULFATE HFA 108 (90 BASE) MCG/ACT IN AERS
1.0000 | INHALATION_SPRAY | Freq: Once | RESPIRATORY_TRACT | Status: AC
  Administered 2015-05-30: 1 via RESPIRATORY_TRACT
  Filled 2015-05-30: qty 6.7

## 2015-05-30 MED ORDER — ALBUTEROL SULFATE (2.5 MG/3ML) 0.083% IN NEBU
5.0000 mg | INHALATION_SOLUTION | Freq: Once | RESPIRATORY_TRACT | Status: AC
  Administered 2015-05-30: 5 mg via RESPIRATORY_TRACT
  Filled 2015-05-30: qty 6
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
## 2015-08-29 HISTORY — PX: COLONOSCOPY: SHX174

## 2015-09-13 ENCOUNTER — Other Ambulatory Visit: Payer: Self-pay | Admitting: Family Medicine

## 2015-09-13 NOTE — Telephone Encounter (Signed)
Pt called and asked for a refill on birth control. Pt will sched appt for AE

## 2015-10-09 ENCOUNTER — Ambulatory Visit (INDEPENDENT_AMBULATORY_CARE_PROVIDER_SITE_OTHER): Payer: 59 | Admitting: Obstetrics & Gynecology

## 2015-10-09 ENCOUNTER — Encounter: Payer: Self-pay | Admitting: Obstetrics & Gynecology

## 2015-10-09 VITALS — BP 102/51 | HR 81 | Resp 18 | Ht 59.0 in | Wt 117.0 lb

## 2015-10-09 DIAGNOSIS — Z124 Encounter for screening for malignant neoplasm of cervix: Secondary | ICD-10-CM | POA: Diagnosis not present

## 2015-10-09 DIAGNOSIS — D251 Intramural leiomyoma of uterus: Secondary | ICD-10-CM

## 2015-10-09 DIAGNOSIS — Z01419 Encounter for gynecological examination (general) (routine) without abnormal findings: Secondary | ICD-10-CM

## 2015-10-09 DIAGNOSIS — Z1151 Encounter for screening for human papillomavirus (HPV): Secondary | ICD-10-CM

## 2015-10-09 DIAGNOSIS — Z0000 Encounter for general adult medical examination without abnormal findings: Secondary | ICD-10-CM
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
## 2015-10-10 LAB — CYTOLOGY - PAP

## 2015-10-15 ENCOUNTER — Ambulatory Visit (HOSPITAL_COMMUNITY)
Admission: RE | Admit: 2015-10-15 | Discharge: 2015-10-15 | Disposition: A | Discharge status: Home / Self Care | Payer: 59 | Source: Ambulatory Visit | Attending: Obstetrics & Gynecology | Admitting: Obstetrics & Gynecology

## 2015-10-15 DIAGNOSIS — D251 Intramural leiomyoma of uterus: Secondary | ICD-10-CM | POA: Insufficient documentation

## 2015-10-15 DIAGNOSIS — N852 Hypertrophy of uterus: Secondary | ICD-10-CM | POA: Diagnosis not present

## 2015-11-06 ENCOUNTER — Ambulatory Visit (INDEPENDENT_AMBULATORY_CARE_PROVIDER_SITE_OTHER): Payer: 59 | Admitting: Obstetrics & Gynecology

## 2015-11-06 ENCOUNTER — Encounter: Payer: Self-pay | Admitting: Obstetrics & Gynecology

## 2015-11-06 VITALS — BP 121/80 | HR 75 | Wt 120.0 lb

## 2015-11-06 DIAGNOSIS — D259 Leiomyoma of uterus, unspecified: Secondary | ICD-10-CM | POA: Diagnosis not present

## 2015-11-06 DIAGNOSIS — D219 Benign neoplasm of connective and other soft tissue, unspecified: Secondary | ICD-10-CM | POA: Insufficient documentation

## 2015-11-06 NOTE — Patient Instructions (Signed)
Uterine Fibroids Uterine fibroids are tissue masses (tumors) that can develop in the womb (uterus). They are also called leiomyomas. This type of tumor is not cancerous (benign) and does not spread to other parts of the body outside of the pelvic area, which is between the hip bones. Occasionally, fibroids may develop in the fallopian tubes, in the cervix, or on the support structures (ligaments) that surround the uterus. You can have one or many fibroids. Fibroids can vary in size, weight, and where they grow in the uterus. Some can become quite large. Most fibroids do not require medical treatment. CAUSES A fibroid can develop when a single uterine cell keeps growing (replicating). Most cells in the human body have a control mechanism that keeps them from replicating without control. SIGNS AND SYMPTOMS Symptoms may include:   Heavy bleeding during your period.  Bleeding or spotting between periods.  Pelvic pain and pressure.  Bladder problems, such as needing to urinate more often (urinary frequency) or urgently.  Inability to reproduce offspring (infertility).  Miscarriages. DIAGNOSIS Uterine fibroids are diagnosed through a physical exam. Your health care provider may feel the lumpy tumors during a pelvic exam. Ultrasonography and an MRI may be done to determine the size, location, and number of fibroids. TREATMENT Treatment may include:  Watchful waiting. This involves getting the fibroid checked by your health care provider to see if it grows or shrinks. Follow your health care provider's recommendations for how often to have this checked.  Hormone medicines. These can be taken by mouth or given through an intrauterine device (IUD).  Surgery.  Removing the fibroids (myomectomy) or the uterus (hysterectomy).  Removing blood supply to the fibroids (uterine artery embolization). If fibroids interfere with your fertility and you want to become pregnant, your health care provider  may recommend having the fibroids removed.  HOME CARE INSTRUCTIONS  Keep all follow-up visits as directed by your health care provider. This is important.  Take medicines only as directed by your health care provider.  If you were prescribed a hormone treatment, take the hormone medicines exactly as directed.  Do not take aspirin, because it can cause bleeding.  Ask your health care provider about taking iron pills and increasing the amount of dark green, leafy vegetables in your diet. These actions can help to boost your blood iron levels, which may be affected by heavy menstrual bleeding.  Pay close attention to your period and tell your health care provider about any changes, such as:  Increased blood flow that requires you to use more pads or tampons than usual per month.  A change in the number of days that your period lasts per month.  A change in symptoms that are associated with your period, such as abdominal cramping or back pain. SEEK MEDICAL CARE IF:  You have pelvic pain, back pain, or abdominal cramps that cannot be controlled with medicines.  You have an increase in bleeding between and during periods.  You soak tampons or pads in a half hour or less.  You feel lightheaded, extra tired, or weak. SEEK IMMEDIATE MEDICAL CARE IF:  You faint.  You have a sudden increase in pelvic pain.   This information is not intended to replace advice given to you by your health care provider. Make sure you discuss any questions you have with your health care provider.   Document Released: 12/11/2000 Document Revised: 01/04/2015 Document Reviewed: 06/12/2014 [employer] Interactive Patient Education 2016 [employer]  

## 2015-11-06 NOTE — Progress Notes (Signed)
CLINIC ENCOUNTER NOTE  History:  34 y.o. G0P0000 here today for follow up on pelvic ultrasound results for evaluation of fibroid uterus. She denies any abnormal vaginal discharge, bleeding, pelvic pain or other concerns.  Reports increased urinary frequency for several months which she attributes to mass effects of the fibroids on her bladder. No dysuria, fevers or other concerns.  Past Medical History  Diagnosis Date  . Genital warts 2013    tx with aldara generic  . Asthma     Past Surgical History  Procedure Laterality Date  . Colonoscopy  08/2015    The following portions of the patient's history were reviewed and updated as appropriate: allergies, current medications, past family history, past medical history, past social history, past surgical history and problem list.   Health Maintenance:  Normal pap and negative HRHPV on 10/09/15.   Review of Systems:  Pertinent items noted in HPI and remainder of comprehensive ROS otherwise negative.  Objective:  Physical Exam BP 121/80 mmHg  Pulse 75  Wt 120 lb (54.432 kg)  LMP 09/12/2015 CONSTITUTIONAL: Well-developed, well-nourished female in no acute distress.  HENT:  Normocephalic, atraumatic. External right and left ear normal. Oropharynx is clear and moist EYES: Conjunctivae and EOM are normal. Pupils are equal, round, and reactive to light. No scleral icterus.  NECK: Normal range of motion, supple, no masses SKIN: Skin is warm and dry. No rash noted. Not diaphoretic. No erythema. No pallor. [place]: Alert and oriented to person, place, and time. Normal reflexes, muscle tone coordination. No cranial nerve deficit noted. PSYCHIATRIC: Normal mood and affect. Normal behavior. Normal judgment and thought content. CARDIOVASCULAR: Normal heart rate noted RESPIRATORY: Effort and breath sounds normal, no problems with respiration noted ABDOMEN: No distention noted.   PELVIC: Deferred MUSCULOSKELETAL: Normal range of motion. No  edema noted.  Labs and Imaging US Transvaginal Non-ob  10/15/2015  CLINICAL DATA:  Patient with enlarged uterus. Evaluate for fibroids. EXAM: TRANSABDOMINAL AND TRANSVAGINAL ULTRASOUND OF PELVIS TECHNIQUE: Both transabdominal and transvaginal ultrasound examinations of the pelvis were performed. Transabdominal technique was performed for global imaging of the pelvis including uterus, ovaries, adnexal regions, and pelvic cul-de-sac. It was necessary to proceed with endovaginal exam following the transabdominal exam to visualize the endometrium and adnexal structures. COMPARISON:  None FINDINGS: Uterus Measurements: 10.5 x 6.3 x 0.7 cm. Anteverted. There multiple large fibroids. There is a 7.3 x 5.9 x 6.2 cm intramural fibroid within the right uterine body, a 4.1 x 3.7 x 4.2 cm intramural fibroid within the left aspect uterine body. Additionally there is a 3.4 x 3.7 x 3.2 cm intramural fibroid within the left uterine body. Endometrium Thickness: 10 mm. Difficult to visualize given multiple adjacent large fibroids. Small amount of fluid in the endometrial canal. Right ovary Measurements: 3.4 x 1.9 x 1.8 cm. Normal appearance/no adnexal mass. Left ovary Measurements: 3.4 x 3.1 x 1.9 cm. Normal appearance/no adnexal mass. Other findings No free fluid. IMPRESSION: Enlarged fibroid uterus. Endometrium measures 10 mm and is difficult to visualize given multiple fibroids. Small amount of fluid in the endometrial canal. The ovaries are poorly assessed due to multiple large uterine fibroids. Electronically Signed   By: Lovey Newcomer M.D.   On: 10/15/2015 09:21   US Pelvis Complete  10/15/2015  CLINICAL DATA:  Patient with enlarged uterus. Evaluate for fibroids. EXAM: TRANSABDOMINAL AND TRANSVAGINAL ULTRASOUND OF PELVIS TECHNIQUE: Both transabdominal and transvaginal ultrasound examinations of the pelvis were performed. Transabdominal technique was performed for global imaging of the  pelvis including uterus, ovaries,  adnexal regions, and pelvic cul-de-sac. It was necessary to proceed with endovaginal exam following the transabdominal exam to visualize the endometrium and adnexal structures. COMPARISON:  None FINDINGS: Uterus Measurements: 10.5 x 6.3 x 0.7 cm. Anteverted. There multiple large fibroids. There is a 7.3 x 5.9 x 6.2 cm intramural fibroid within the right uterine body, a 4.1 x 3.7 x 4.2 cm intramural fibroid within the left aspect uterine body. Additionally there is a 3.4 x 3.7 x 3.2 cm intramural fibroid within the left uterine body. Endometrium Thickness: 10 mm. Difficult to visualize given multiple adjacent large fibroids. Small amount of fluid in the endometrial canal. Right ovary Measurements: 3.4 x 1.9 x 1.8 cm. Normal appearance/no adnexal mass. Left ovary Measurements: 3.4 x 3.1 x 1.9 cm. Normal appearance/no adnexal mass. Other findings No free fluid. IMPRESSION: Enlarged fibroid uterus. Endometrium measures 10 mm and is difficult to visualize given multiple fibroids. Small amount of fluid in the endometrial canal. The ovaries are poorly assessed due to multiple large uterine fibroids. Electronically Signed   By: Lovey Newcomer M.D.   On: 10/15/2015 09:21    Assessment & Plan:  Uterine leiomyoma, unspecified location Discussed ultrasound findings, all questions answered.  Patient feels she is asymptomatic, no surgical intervention needed for now.  Wants to try to conceive soon, recommended being on prenatal vitamins.  If no conception within 6 months, she will come back for reevaluation. OTC ovulation predictor kits recommended. Routine preventative health maintenance measures emphasized.   Total face-to-face time with patient: 15 minutes. Over 50% of encounter was spent on counseling and coordination of care.   Verita Schneiders, MD, [place] Attending Obstetrician & Gynecologist, [place] [facility]

## 2016-01-03 MED FILL — LO LOESTRIN FE 1-10 TABLET: 1 MG-10 MCG | 28 days supply | Qty: 28 | Fill #3

## 2016-01-03 MED FILL — AMITIZA 24 MCG CAPSULES: 24 | 30 days supply | Qty: 30 | Fill #5

## 2016-02-03 MED FILL — LO LOESTRIN FE 1-10 TABLET: 1 MG-10 MCG | 28 days supply | Qty: 28 | Fill #4

## 2016-03-02 MED FILL — AMITIZA 24 MCG CAPSULES: 24 | 30 days supply | Qty: 30 | Fill #0

## 2016-03-02 MED FILL — LO LOESTRIN FE 1-10 TABLET: 1 MG-10 MCG | 28 days supply | Qty: 28 | Fill #5

## 2016-03-30 MED FILL — LO LOESTRIN FE 1-10 TABLET: 1 MG-10 MCG | 28 days supply | Qty: 28 | Fill #6

## 2016-04-27 MED FILL — LO LOESTRIN FE 1-10 TABLET: 1 MG-10 MCG | 84 days supply | Qty: 84 | Fill #7

## 2016-07-20 ENCOUNTER — Other Ambulatory Visit: Payer: Self-pay | Admitting: Family Medicine

## 2016-07-20 MED FILL — LO LOESTRIN FE 1-10 TABLET: 1 MG-10 MCG | 84 days supply | Qty: 84 | Fill #0

## 2016-10-13 ENCOUNTER — Encounter: Payer: Self-pay | Admitting: Family Medicine

## 2016-10-13 ENCOUNTER — Ambulatory Visit (INDEPENDENT_AMBULATORY_CARE_PROVIDER_SITE_OTHER): Payer: 59 | Admitting: Family Medicine

## 2016-10-13 VITALS — BP 101/61 | HR 70 | Ht 59.0 in | Wt 126.0 lb

## 2016-10-13 DIAGNOSIS — Z1151 Encounter for screening for human papillomavirus (HPV): Secondary | ICD-10-CM | POA: Diagnosis not present

## 2016-10-13 DIAGNOSIS — Z124 Encounter for screening for malignant neoplasm of cervix: Secondary | ICD-10-CM

## 2016-10-13 DIAGNOSIS — Z01411 Encounter for gynecological examination (general) (routine) with abnormal findings: Secondary | ICD-10-CM | POA: Diagnosis not present

## 2016-10-13 DIAGNOSIS — Z113 Encounter for screening for infections with a predominantly sexual mode of transmission: Secondary | ICD-10-CM | POA: Diagnosis not present

## 2016-10-13 DIAGNOSIS — Z3400 Encounter for supervision of normal first pregnancy, unspecified trimester: Secondary | ICD-10-CM

## 2016-10-13 DIAGNOSIS — Z3201 Encounter for pregnancy test, result positive: Secondary | ICD-10-CM

## 2016-10-13 DIAGNOSIS — Z6791 Unspecified blood type, Rh negative: Secondary | ICD-10-CM

## 2016-10-13 DIAGNOSIS — Z331 Pregnant state, incidental: Secondary | ICD-10-CM

## 2016-10-13 DIAGNOSIS — N912 Amenorrhea, unspecified: Secondary | ICD-10-CM

## 2016-10-13 DIAGNOSIS — O26899 Other specified pregnancy related conditions, unspecified trimester: Secondary | ICD-10-CM

## 2016-10-13 LAB — POCT URINE PREGNANCY: PREG TEST UR: POSITIVE — AB

## 2016-10-13 NOTE — Progress Notes (Signed)
   Subjective:     Ana Davis is a 35 y.o. female and is here for a comprehensive physical exam. The patient reports problems - stopped her OCP's one month ago. C/o breast tenderness. LMP 4 wks ago. Has h/o fibroid uterus..  Social History   Social History  . Marital status: Married    Spouse name: N/A  . Number of children: N/A  . Years of education: N/A   Occupational History  . Not on file.   Social History Main Topics  . Smoking status: Never Smoker  . Smokeless tobacco: Never Used  . Alcohol use Yes     Comment: social  . Drug use: No  . Sexual activity: Yes    Partners: Male    Birth control/ protection: OCP, Pill   Other Topics Concern  . Not on file   Social History Narrative  . No narrative on file   Health Maintenance  Topic Date Due  . HIV Screening  05/18/1995  . TETANUS/TDAP  05/18/1999  . INFLUENZA VACCINE  07/28/2016  . PAP SMEAR  10/08/2018    The following portions of the patient's history were reviewed and updated as appropriate: allergies, current medications, past family history, past medical history, past social history, past surgical history and problem list.  Review of Systems Pertinent items noted in HPI and remainder of comprehensive ROS otherwise negative.   Objective:    BP 101/61   Pulse 70   Ht 4\' 11"  (1.499 m)   Wt 126 lb (57.2 kg)   LMP 09/12/2016   BMI 25.45 kg/m  General appearance: alert, cooperative and appears stated age Head: Normocephalic, without obvious abnormality, atraumatic Neck: no adenopathy, supple, symmetrical, trachea midline and thyroid not enlarged, symmetric, no tenderness/mass/nodules Lungs: clear to auscultation bilaterally Breasts: normal appearance, no masses or tenderness Heart: regular rate and rhythm, S1, S2 normal, no murmur, click, rub or gallop Abdomen: soft, non-tender; bowel sounds normal; no masses,  no organomegaly Pelvic: cervix normal in appearance, external genitalia normal, no  adnexal masses or tenderness, no cervical motion tenderness, uterus normal size, shape, and consistency and vagina normal without discharge Extremities: extremities normal, atraumatic, no cyanosis or edema Pulses: 2+ and symmetric Skin: Skin color, texture, turgor normal. No rashes or lesions Lymph nodes: Cervical, supraclavicular, and axillary nodes normal. Neurologic: Grossly normal    Assessment:    Healthy female exam.      Plan:      Problem List Items Addressed This Visit      Unprioritized   Supervision of normal first pregnancy, antepartum    Other Visit Diagnoses    Encounter for gynecological examination with abnormal finding    -  Primary   Relevant Orders   Comprehensive metabolic panel   TSH   Lipid panel   Screening for malignant neoplasm of cervix       Relevant Orders   Cytology - PAP   GC/Chlamydia probe amp ([place])not at [facility]   Amenorrhea       Relevant Orders   POCT urine pregnancy (Completed)   Pregnancy, incidental       Relevant Orders   Prenatal Profile   Hemoglobinopathy Evaluation   Cystic fibrosis diagnostic study   Culture, OB Urine      See After Visit Summary for Counseling Recommendations

## 2016-10-13 NOTE — Patient Instructions (Signed)
Preventive Care for Adults, Female A healthy lifestyle and preventive care can promote health and wellness. Preventive health guidelines for women include the following key practices.  A routine yearly physical is a good way to check with your health care provider about your health and preventive screening. It is a chance to share any concerns and updates on your health and to receive a thorough exam.  Visit your dentist for a routine exam and preventive care every 6 months. Brush your teeth twice a day and floss once a day. Good oral hygiene prevents tooth decay and gum disease.  The frequency of eye exams is based on your age, health, family medical history, use of contact lenses, and other factors. Follow your health care provider's recommendations for frequency of eye exams.  Eat a healthy diet. Foods like vegetables, fruits, whole grains, low-fat dairy products, and lean protein foods contain the nutrients you need without too many calories. Decrease your intake of foods high in solid fats, added sugars, and salt. Eat the right amount of calories for you.Get information about a proper diet from your health care provider, if necessary.  Regular physical exercise is one of the most important things you can do for your health. Most adults should get at least 150 minutes of moderate-intensity exercise (any activity that increases your heart rate and causes you to sweat) each week. In addition, most adults need muscle-strengthening exercises on 2 or more days a week.  Maintain a healthy weight. The body mass index (BMI) is a screening tool to identify possible weight problems. It provides an estimate of body fat based on height and weight. Your health care provider can find your BMI and can help you achieve or maintain a healthy weight.For adults 20 years and older:  A BMI below 18.5 is considered underweight.  A BMI of 18.5 to 24.9 is normal.  A BMI of 25 to 29.9 is considered overweight.  A  BMI of 30 and above is considered obese.  Maintain normal blood lipids and cholesterol levels by exercising and minimizing your intake of saturated fat. Eat a balanced diet with plenty of fruit and vegetables. Blood tests for lipids and cholesterol should begin at age 45 and be repeated every 5 years. If your lipid or cholesterol levels are high, you are over 50, or you are at high risk for heart disease, you may need your cholesterol levels checked more frequently.Ongoing high lipid and cholesterol levels should be treated with medicines if diet and exercise are not working.  If you smoke, find out from your health care provider how to quit. If you do not use tobacco, do not start.  Lung cancer screening is recommended for adults aged 45-80 years who are at high risk for developing lung cancer because of a history of smoking. A yearly low-dose CT scan of the lungs is recommended for people who have at least a 30-pack-year history of smoking and are a current smoker or have quit within the past 15 years. A pack year of smoking is smoking an average of 1 pack of cigarettes a day for 1 year (for example: 1 pack a day for 30 years or 2 packs a day for 15 years). Yearly screening should continue until the smoker has stopped smoking for at least 15 years. Yearly screening should be stopped for people who develop a health problem that would prevent them from having lung cancer treatment.  If you are pregnant, do not drink alcohol. If you are  breastfeeding, be very cautious about drinking alcohol. If you are not pregnant and choose to drink alcohol, do not have more than 1 drink per day. One drink is considered to be 12 ounces (355 mL) of beer, 5 ounces (148 mL) of wine, or 1.5 ounces (44 mL) of liquor.  Avoid use of street drugs. Do not share needles with anyone. Ask for help if you need support or instructions about stopping the use of drugs.  High blood pressure causes heart disease and increases the risk  of stroke. Your blood pressure should be checked at least every 1 to 2 years. Ongoing high blood pressure should be treated with medicines if weight loss and exercise do not work.  If you are 55-79 years old, ask your health care provider if you should take aspirin to prevent strokes.  Diabetes screening is done by taking a blood sample to check your blood glucose level after you have not eaten for a certain period of time (fasting). If you are not overweight and you do not have risk factors for diabetes, you should be screened once every 3 years starting at age 45. If you are overweight or obese and you are 40-70 years of age, you should be screened for diabetes every year as part of your cardiovascular risk assessment.  Breast cancer screening is essential preventive care for women. You should practice "breast self-awareness." This means understanding the normal appearance and feel of your breasts and may include breast self-examination. Any changes detected, no matter how small, should be reported to a health care provider. Women in their 20s and 30s should have a clinical breast exam (CBE) by a health care provider as part of a regular health exam every 1 to 3 years. After age 40, women should have a CBE every year. Starting at age 40, women should consider having a mammogram (breast X-ray test) every year. Women who have a family history of breast cancer should talk to their health care provider about genetic screening. Women at a high risk of breast cancer should talk to their health care providers about having an MRI and a mammogram every year.  Breast cancer gene (BRCA)-related cancer risk assessment is recommended for women who have family members with BRCA-related cancers. BRCA-related cancers include breast, ovarian, tubal, and peritoneal cancers. Having family members with these cancers may be associated with an increased risk for harmful changes (mutations) in the breast cancer genes BRCA1 and  BRCA2. Results of the assessment will determine the need for genetic counseling and BRCA1 and BRCA2 testing.  Your health care provider may recommend that you be screened regularly for cancer of the pelvic organs (ovaries, uterus, and vagina). This screening involves a pelvic examination, including checking for microscopic changes to the surface of your cervix (Pap test). You may be encouraged to have this screening done every 3 years, beginning at age 21.  For women ages 30-65, health care providers may recommend pelvic exams and Pap testing every 3 years, or they may recommend the Pap and pelvic exam, combined with testing for human papilloma virus (HPV), every 5 years. Some types of HPV increase your risk of cervical cancer. Testing for HPV may also be done on women of any age with unclear Pap test results.  Other health care providers may not recommend any screening for nonpregnant women who are considered low risk for pelvic cancer and who do not have symptoms. Ask your health care provider if a screening pelvic exam is right for   you.  If you have had past treatment for cervical cancer or a condition that could lead to cancer, you need Pap tests and screening for cancer for at least 20 years after your treatment. If Pap tests have been discontinued, your risk factors (such as having a new sexual partner) need to be reassessed to determine if screening should resume. Some women have medical problems that increase the chance of getting cervical cancer. In these cases, your health care provider may recommend more frequent screening and Pap tests.  Colorectal cancer can be detected and often prevented. Most routine colorectal cancer screening begins at the age of 50 years and continues through age 75 years. However, your health care provider may recommend screening at an earlier age if you have risk factors for colon cancer. On a yearly basis, your health care provider may provide home test kits to check  for hidden blood in the stool. Use of a small camera at the end of a tube, to directly examine the colon (sigmoidoscopy or colonoscopy), can detect the earliest forms of colorectal cancer. Talk to your health care provider about this at age 50, when routine screening begins. Direct exam of the colon should be repeated every 5-10 years through age 75 years, unless early forms of precancerous polyps or small growths are found.  People who are at an increased risk for hepatitis B should be screened for this virus. You are considered at high risk for hepatitis B if:  You were born in a country where hepatitis B occurs often. Talk with your health care provider about which countries are considered high risk.  Your parents were born in a high-risk country and you have not received a shot to protect against hepatitis B (hepatitis B vaccine).  You have HIV or AIDS.  You use needles to inject street drugs.  You live with, or have sex with, someone who has hepatitis B.  You get hemodialysis treatment.  You take certain medicines for conditions like cancer, organ transplantation, and autoimmune conditions.  Hepatitis C blood testing is recommended for all people born from 1945 through 1965 and any individual with known risks for hepatitis C.  Practice safe sex. Use condoms and avoid high-risk sexual practices to reduce the spread of sexually transmitted infections (STIs). STIs include gonorrhea, chlamydia, syphilis, trichomonas, herpes, HPV, and human immunodeficiency virus (HIV). Herpes, HIV, and HPV are viral illnesses that have no cure. They can result in disability, cancer, and death.  You should be screened for sexually transmitted illnesses (STIs) including gonorrhea and chlamydia if:  You are sexually active and are younger than 24 years.  You are older than 24 years and your health care provider tells you that you are at risk for this type of infection.  Your sexual activity has changed  since you were last screened and you are at an increased risk for chlamydia or gonorrhea. Ask your health care provider if you are at risk.  If you are at risk of being infected with HIV, it is recommended that you take a prescription medicine daily to prevent HIV infection. This is called preexposure prophylaxis (PrEP). You are considered at risk if:  You are sexually active and do not regularly use condoms or know the HIV status of your partner(s).  You take drugs by injection.  You are sexually active with a partner who has HIV.  Talk with your health care provider about whether you are at high risk of being infected with HIV. If   you choose to begin PrEP, you should first be tested for HIV. You should then be tested every 3 months for as long as you are taking PrEP.  Osteoporosis is a disease in which the bones lose minerals and strength with aging. This can result in serious bone fractures or breaks. The risk of osteoporosis can be identified using a bone density scan. Women ages 21 years and over and women at risk for fractures or osteoporosis should discuss screening with their health care providers. Ask your health care provider whether you should take a calcium supplement or vitamin D to reduce the rate of osteoporosis.  Menopause can be associated with physical symptoms and risks. Hormone replacement therapy is available to decrease symptoms and risks. You should talk to your health care provider about whether hormone replacement therapy is right for you.  Use sunscreen. Apply sunscreen liberally and repeatedly throughout the day. You should seek shade when your shadow is shorter than you. Protect yourself by wearing long sleeves, pants, a wide-brimmed hat, and sunglasses year round, whenever you are outdoors.  Once a month, do a whole body skin exam, using a mirror to look at the skin on your back. Tell your health care provider of new moles, moles that have irregular borders, moles that  are larger than a pencil eraser, or moles that have changed in shape or color.  Stay current with required vaccines (immunizations).  Influenza vaccine. All adults should be immunized every year.  Tetanus, diphtheria, and acellular pertussis (Td, Tdap) vaccine. Pregnant women should receive 1 dose of Tdap vaccine during each pregnancy. The dose should be obtained regardless of the length of time since the last dose. Immunization is preferred during the 27th-36th week of gestation. An adult who has not previously received Tdap or who does not know her vaccine status should receive 1 dose of Tdap. This initial dose should be followed by tetanus and diphtheria toxoids (Td) booster doses every 10 years. Adults with an unknown or incomplete history of completing a 3-dose immunization series with Td-containing vaccines should begin or complete a primary immunization series including a Tdap dose. Adults should receive a Td booster every 10 years.  Varicella vaccine. An adult without evidence of immunity to varicella should receive 2 doses or a second dose if she has previously received 1 dose. Pregnant females who do not have evidence of immunity should receive the first dose after pregnancy. This first dose should be obtained before leaving the health care facility. The second dose should be obtained 4-8 weeks after the first dose.  Human papillomavirus (HPV) vaccine. Females aged 13-26 years who have not received the vaccine previously should obtain the 3-dose series. The vaccine is not recommended for use in pregnant females. However, pregnancy testing is not needed before receiving a dose. If a female is found to be pregnant after receiving a dose, no treatment is needed. In that case, the remaining doses should be delayed until after the pregnancy. Immunization is recommended for any person with an immunocompromised condition through the age of 66 years if she did not get any or all doses earlier. During the  3-dose series, the second dose should be obtained 4-8 weeks after the first dose. The third dose should be obtained 24 weeks after the first dose and 16 weeks after the second dose.  Zoster vaccine. One dose is recommended for adults aged 26 years or older unless certain conditions are present.  Measles, mumps, and rubella (MMR) vaccine. Adults born  before 1957 generally are considered immune to measles and mumps. Adults born in 1957 or later should have 1 or more doses of MMR vaccine unless there is a contraindication to the vaccine or there is laboratory evidence of immunity to each of the three diseases. A routine second dose of MMR vaccine should be obtained at least 28 days after the first dose for students attending postsecondary schools, health care workers, or international travelers. People who received inactivated measles vaccine or an unknown type of measles vaccine during 1963-1967 should receive 2 doses of MMR vaccine. People who received inactivated mumps vaccine or an unknown type of mumps vaccine before 1979 and are at high risk for mumps infection should consider immunization with 2 doses of MMR vaccine. For females of childbearing age, rubella immunity should be determined. If there is no evidence of immunity, females who are not pregnant should be vaccinated. If there is no evidence of immunity, females who are pregnant should delay immunization until after pregnancy. Unvaccinated health care workers born before 1957 who lack laboratory evidence of measles, mumps, or rubella immunity or laboratory confirmation of disease should consider measles and mumps immunization with 2 doses of MMR vaccine or rubella immunization with 1 dose of MMR vaccine.  Pneumococcal 13-valent conjugate (PCV13) vaccine. When indicated, a person who is uncertain of his immunization history and has no record of immunization should receive the PCV13 vaccine. All adults 65 years of age and older should receive this  vaccine. An adult aged 19 years or older who has certain medical conditions and has not been previously immunized should receive 1 dose of PCV13 vaccine. This PCV13 should be followed with a dose of pneumococcal polysaccharide (PPSV23) vaccine. Adults who are at high risk for pneumococcal disease should obtain the PPSV23 vaccine at least 8 weeks after the dose of PCV13 vaccine. Adults older than 36 years of age who have normal immune system function should obtain the PPSV23 vaccine dose at least 1 year after the dose of PCV13 vaccine.  Pneumococcal polysaccharide (PPSV23) vaccine. When PCV13 is also indicated, PCV13 should be obtained first. All adults aged 65 years and older should be immunized. An adult younger than age 65 years who has certain medical conditions should be immunized. Any person who resides in a nursing home or long-term care facility should be immunized. An adult smoker should be immunized. People with an immunocompromised condition and certain other conditions should receive both PCV13 and PPSV23 vaccines. People with human immunodeficiency virus (HIV) infection should be immunized as soon as possible after diagnosis. Immunization during chemotherapy or radiation therapy should be avoided. Routine use of PPSV23 vaccine is not recommended for American Indians, [place] Natives, or people younger than 65 years unless there are medical conditions that require PPSV23 vaccine. When indicated, people who have unknown immunization and have no record of immunization should receive PPSV23 vaccine. One-time revaccination 5 years after the first dose of PPSV23 is recommended for people aged 19-64 years who have chronic kidney failure, nephrotic syndrome, asplenia, or immunocompromised conditions. People who received 1-2 doses of PPSV23 before age 65 years should receive another dose of PPSV23 vaccine at age 65 years or later if at least 5 years have passed since the previous dose. Doses of PPSV23 are not  needed for people immunized with PPSV23 at or after age 65 years.  Meningococcal vaccine. Adults with asplenia or persistent complement component deficiencies should receive 2 doses of quadrivalent meningococcal conjugate (MenACWY-D) vaccine. The doses should be obtained   at least 2 months apart. Microbiologists working with certain meningococcal bacteria, [place] recruits, people at risk during an outbreak, and people who travel to or live in countries with a high rate of meningitis should be immunized. A first-year college student up through age 75 years who is living in a residence hall should receive a dose if she did not receive a dose on or after her 16th birthday. Adults who have certain high-risk conditions should receive one or more doses of vaccine.  Hepatitis A vaccine. Adults who wish to be protected from this disease, have certain high-risk conditions, work with hepatitis A-infected animals, work in hepatitis A research labs, or travel to or work in countries with a high rate of hepatitis A should be immunized. Adults who were previously unvaccinated and who anticipate close contact with an international adoptee during the first 60 days after arrival in the [place] from a country with a high rate of hepatitis A should be immunized.  Hepatitis B vaccine. Adults who wish to be protected from this disease, have certain high-risk conditions, may be exposed to blood or other infectious body fluids, are household contacts or sex partners of hepatitis B positive people, are clients or workers in certain care facilities, or travel to or work in countries with a high rate of hepatitis B should be immunized.  Haemophilus influenzae type b (Hib) vaccine. A previously unvaccinated person with asplenia or sickle cell disease or having a scheduled splenectomy should receive 1 dose of Hib vaccine. Regardless of previous immunization, a recipient of a hematopoietic stem cell transplant should receive a  3-dose series 6-12 months after her successful transplant. Hib vaccine is not recommended for adults with HIV infection. Preventive Services / Frequency Ages 25 to 53 years  Blood pressure check.** / Every 3-5 years.  Lipid and cholesterol check.** / Every 5 years beginning at age 43.  Clinical breast exam.** / Every 3 years for women in their 47s and 16s.  BRCA-related cancer risk assessment.** / For women who have family members with a BRCA-related cancer (breast, ovarian, tubal, or peritoneal cancers).  Pap test.** / Every 2 years from ages 10 through 72. Every 3 years starting at age 99 through age 51 or 44 with a history of 3 consecutive normal Pap tests.  HPV screening.** / Every 3 years from ages 29 through ages 72 to 54 with a history of 3 consecutive normal Pap tests.  Hepatitis C blood test.** / For any individual with known risks for hepatitis C.  Skin self-exam. / Monthly.  Influenza vaccine. / Every year.  Tetanus, diphtheria, and acellular pertussis (Tdap, Td) vaccine.** / Consult your health care provider. Pregnant women should receive 1 dose of Tdap vaccine during each pregnancy. 1 dose of Td every 10 years.  Varicella vaccine.** / Consult your health care provider. Pregnant females who do not have evidence of immunity should receive the first dose after pregnancy.  HPV vaccine. / 3 doses over 6 months, if 9 and younger. The vaccine is not recommended for use in pregnant females. However, pregnancy testing is not needed before receiving a dose.  Measles, mumps, rubella (MMR) vaccine.** / You need at least 1 dose of MMR if you were born in 1957 or later. You may also need a 2nd dose. For females of childbearing age, rubella immunity should be determined. If there is no evidence of immunity, females who are not pregnant should be vaccinated. If there is no evidence of immunity, females who are  pregnant should delay immunization until after pregnancy.  Pneumococcal  13-valent conjugate (PCV13) vaccine.** / Consult your health care provider.  Pneumococcal polysaccharide (PPSV23) vaccine.** / 1 to 2 doses if you smoke cigarettes or if you have certain conditions.  Meningococcal vaccine.** / 1 dose if you are age 68 to 8 years and a Market researcher living in a residence hall, or have one of several medical conditions, you need to get vaccinated against meningococcal disease. You may also need additional booster doses.  Hepatitis A vaccine.** / Consult your health care provider.  Hepatitis B vaccine.** / Consult your health care provider.  Haemophilus influenzae type b (Hib) vaccine.** / Consult your health care provider. Ages 7 to 53 years  Blood pressure check.** / Every year.  Lipid and cholesterol check.** / Every 5 years beginning at age 25 years.  Lung cancer screening. / Every year if you are aged 11-80 years and have a 30-pack-year history of smoking and currently smoke or have quit within the past 15 years. Yearly screening is stopped once you have quit smoking for at least 15 years or develop a health problem that would prevent you from having lung cancer treatment.  Clinical breast exam.** / Every year after age 48 years.  BRCA-related cancer risk assessment.** / For women who have family members with a BRCA-related cancer (breast, ovarian, tubal, or peritoneal cancers).  Mammogram.** / Every year beginning at age 41 years and continuing for as long as you are in good health. Consult with your health care provider.  Pap test.** / Every 3 years starting at age 65 years through age 37 or 70 years with a history of 3 consecutive normal Pap tests.  HPV screening.** / Every 3 years from ages 72 years through ages 60 to 40 years with a history of 3 consecutive normal Pap tests.  Fecal occult blood test (FOBT) of stool. / Every year beginning at age 21 years and continuing until age 5 years. You may not need to do this test if you get  a colonoscopy every 10 years.  Flexible sigmoidoscopy or colonoscopy.** / Every 5 years for a flexible sigmoidoscopy or every 10 years for a colonoscopy beginning at age 35 years and continuing until age 48 years.  Hepatitis C blood test.** / For all people born from [birth year] through 1965 and any individual with known risks for hepatitis C.  Skin self-exam. / Monthly.  Influenza vaccine. / Every year.  Tetanus, diphtheria, and acellular pertussis (Tdap/Td) vaccine.** / Consult your health care provider. Pregnant women should receive 1 dose of Tdap vaccine during each pregnancy. 1 dose of Td every 10 years.  Varicella vaccine.** / Consult your health care provider. Pregnant females who do not have evidence of immunity should receive the first dose after pregnancy.  Zoster vaccine.** / 1 dose for adults aged 30 years or older.  Measles, mumps, rubella (MMR) vaccine.** / You need at least 1 dose of MMR if you were born in 1957 or later. You may also need a second dose. For females of childbearing age, rubella immunity should be determined. If there is no evidence of immunity, females who are not pregnant should be vaccinated. If there is no evidence of immunity, females who are pregnant should delay immunization until after pregnancy.  Pneumococcal 13-valent conjugate (PCV13) vaccine.** / Consult your health care provider.  Pneumococcal polysaccharide (PPSV23) vaccine.** / 1 to 2 doses if you smoke cigarettes or if you have certain conditions.  Meningococcal vaccine.** /  Consult your health care provider.  Hepatitis A vaccine.** / Consult your health care provider.  Hepatitis B vaccine.** / Consult your health care provider.  Haemophilus influenzae type b (Hib) vaccine.** / Consult your health care provider. Ages 69 years and over  Blood pressure check.** / Every year.  Lipid and cholesterol check.** / Every 5 years beginning at age 8 years.  Lung cancer screening. / Every year if you  are aged 51-80 years and have a 30-pack-year history of smoking and currently smoke or have quit within the past 15 years. Yearly screening is stopped once you have quit smoking for at least 15 years or develop a health problem that would prevent you from having lung cancer treatment.  Clinical breast exam.** / Every year after age 27 years.  BRCA-related cancer risk assessment.** / For women who have family members with a BRCA-related cancer (breast, ovarian, tubal, or peritoneal cancers).  Mammogram.** / Every year beginning at age 22 years and continuing for as long as you are in good health. Consult with your health care provider.  Pap test.** / Every 3 years starting at age 31 years through age 96 or 34 years with 3 consecutive normal Pap tests. Testing can be stopped between 65 and 70 years with 3 consecutive normal Pap tests and no abnormal Pap or HPV tests in the past 10 years.  HPV screening.** / Every 3 years from ages 46 years through ages 32 or 47 years with a history of 3 consecutive normal Pap tests. Testing can be stopped between 65 and 70 years with 3 consecutive normal Pap tests and no abnormal Pap or HPV tests in the past 10 years.  Fecal occult blood test (FOBT) of stool. / Every year beginning at age 92 years and continuing until age 28 years. You may not need to do this test if you get a colonoscopy every 10 years.  Flexible sigmoidoscopy or colonoscopy.** / Every 5 years for a flexible sigmoidoscopy or every 10 years for a colonoscopy beginning at age 86 years and continuing until age 69 years.  Hepatitis C blood test.** / For all people born from [birth year] through 1966 and any individual with known risks for hepatitis C.  Osteoporosis screening.** / A one-time screening for women ages 43 years and over and women at risk for fractures or osteoporosis.  Skin self-exam. / Monthly.  Influenza vaccine. / Every year.  Tetanus, diphtheria, and acellular pertussis (Tdap/Td)  vaccine.** / 1 dose of Td every 10 years.  Varicella vaccine.** / Consult your health care provider.  Zoster vaccine.** / 1 dose for adults aged 61 years or older.  Pneumococcal 13-valent conjugate (PCV13) vaccine.** / Consult your health care provider.  Pneumococcal polysaccharide (PPSV23) vaccine.** / 1 dose for all adults aged 47 years and older.  Meningococcal vaccine.** / Consult your health care provider.  Hepatitis A vaccine.** / Consult your health care provider.  Hepatitis B vaccine.** / Consult your health care provider.  Haemophilus influenzae type b (Hib) vaccine.** / Consult your health care provider. ** Family history and personal history of risk and conditions may change your health care provider's recommendations.   This information is not intended to replace advice given to you by your health care provider. Make sure you discuss any questions you have with your health care provider.   Document Released: 02/09/2002 Document Revised: 01/04/2015 Document Reviewed: 05/11/2011 Elsevier Interactive Patient Education [employer].

## 2016-10-14 DIAGNOSIS — O26899 Other specified pregnancy related conditions, unspecified trimester: Secondary | ICD-10-CM

## 2016-10-14 DIAGNOSIS — Z6791 Unspecified blood type, Rh negative: Secondary | ICD-10-CM | POA: Insufficient documentation

## 2016-10-14 LAB — COMPREHENSIVE METABOLIC PANEL
ALT: 25 U/L (ref 6–29)
AST: 22 U/L (ref 10–30)
Albumin: 3.7 g/dL (ref 3.6–5.1)
Alkaline Phosphatase: 58 U/L (ref 33–115)
BUN: 11 mg/dL (ref 7–25)
CHLORIDE: 102 mmol/L (ref 98–110)
CO2: 22 mmol/L (ref 20–31)
Calcium: 8.9 mg/dL (ref 8.6–10.2)
Creat: 0.82 mg/dL (ref 0.50–1.10)
GLUCOSE: 117 mg/dL — AB (ref 65–99)
POTASSIUM: 4.1 mmol/L (ref 3.5–5.3)
Sodium: 135 mmol/L (ref 135–146)
Total Bilirubin: 0.5 mg/dL (ref 0.2–1.2)
Total Protein: 6.8 g/dL (ref 6.1–8.1)

## 2016-10-14 LAB — PRENATAL PROFILE (SOLSTAS)
Antibody Screen: NEGATIVE
Basophils Absolute: 0 cells/uL (ref 0–200)
Basophils Relative: 0 %
Eosinophils Absolute: 46 cells/uL (ref 15–500)
Eosinophils Relative: 1 %
HEMATOCRIT: 39.4 % (ref 35.0–45.0)
HIV: NONREACTIVE
Hemoglobin: 12.7 g/dL (ref 11.7–15.5)
Hepatitis B Surface Ag: NEGATIVE
LYMPHS PCT: 36 %
Lymphs Abs: 1656 cells/uL (ref 850–3900)
MCH: 31 pg (ref 27.0–33.0)
MCHC: 32.2 g/dL (ref 32.0–36.0)
MCV: 96.1 fL (ref 80.0–100.0)
MONO ABS: 414 {cells}/uL (ref 200–950)
MONOS PCT: 9 %
MPV: 8.9 fL (ref 7.5–12.5)
NEUTROS PCT: 54 %
Neutro Abs: 2484 cells/uL (ref 1500–7800)
PLATELETS: 300 10*3/uL (ref 140–400)
RBC: 4.1 MIL/uL (ref 3.80–5.10)
RDW: 12.5 % (ref 11.0–15.0)
RH TYPE: NEGATIVE
Rubella: 2.7 Index — ABNORMAL HIGH (ref <0.90)
WBC: 4.6 10*3/uL (ref 3.8–10.8)

## 2016-10-14 LAB — GC/CHLAMYDIA PROBE AMP (~~LOC~~) NOT AT ARMC
CHLAMYDIA, DNA PROBE: NEGATIVE
NEISSERIA GONORRHEA: NEGATIVE

## 2016-10-14 LAB — LIPID PANEL
CHOL/HDL RATIO: 2.7 ratio (ref <=5.0)
Cholesterol: 115 mg/dL — ABNORMAL LOW (ref 125–200)
HDL: 42 mg/dL — AB (ref >=46)
LDL CALC: 62 mg/dL (ref <130)
Triglycerides: 53 mg/dL (ref <150)
VLDL: 11 mg/dL (ref <30)

## 2016-10-14 LAB — CYTOLOGY - PAP
Adequacy: ABSENT
DIAGNOSIS: NEGATIVE
HPV (WINDOPATH): NOT DETECTED

## 2016-10-14 LAB — TSH: TSH: 1.34 m[IU]/L

## 2016-10-15 LAB — CULTURE, OB URINE
COLONY COUNT: NO GROWTH
ORGANISM ID, BACTERIA: NO GROWTH

## 2016-10-19 LAB — HEMOGLOBINOPATHY EVALUATION
HCT: 39.4 % (ref 35.0–45.0)
HGB A: 96.4 % (ref >96.0)
Hemoglobin: 12.7 g/dL (ref 11.7–15.5)
Hgb A2 Quant: 2.6 % (ref 1.8–3.5)
MCH: 31 pg (ref 27.0–33.0)
MCV: 96.1 fL (ref 80.0–100.0)
RBC: 4.1 MIL/uL (ref 3.80–5.10)
RDW: 12.5 % (ref 11.0–15.0)

## 2016-10-20 ENCOUNTER — Encounter: Payer: Self-pay | Admitting: Family Medicine

## 2016-10-20 DIAGNOSIS — O09529 Supervision of elderly multigravida, unspecified trimester: Secondary | ICD-10-CM | POA: Insufficient documentation

## 2016-10-20 LAB — CYSTIC FIBROSIS DIAGNOSTIC STUDY

## 2016-11-10 ENCOUNTER — Encounter: Payer: 59 | Admitting: Family Medicine

## 2016-11-10 DIAGNOSIS — O418X90 Other specified disorders of amniotic fluid and membranes, unspecified trimester, not applicable or unspecified: Secondary | ICD-10-CM | POA: Diagnosis not present

## 2016-11-10 DIAGNOSIS — O09511 Supervision of elderly primigravida, first trimester: Secondary | ICD-10-CM | POA: Diagnosis not present

## 2016-11-10 DIAGNOSIS — Z3A08 8 weeks gestation of pregnancy: Secondary | ICD-10-CM | POA: Diagnosis not present

## 2016-12-02 DIAGNOSIS — Z3A11 11 weeks gestation of pregnancy: Secondary | ICD-10-CM | POA: Diagnosis not present

## 2016-12-02 DIAGNOSIS — Z36 Encounter for antenatal screening for chromosomal anomalies: Secondary | ICD-10-CM | POA: Diagnosis not present

## 2016-12-02 DIAGNOSIS — O3411 Maternal care for benign tumor of corpus uteri, first trimester: Secondary | ICD-10-CM | POA: Diagnosis not present

## 2016-12-02 DIAGNOSIS — O09511 Supervision of elderly primigravida, first trimester: Secondary | ICD-10-CM | POA: Diagnosis not present

## 2016-12-08 ENCOUNTER — Encounter (HOSPITAL_COMMUNITY): Payer: Self-pay | Admitting: *Deleted

## 2016-12-08 ENCOUNTER — Inpatient Hospital Stay (HOSPITAL_COMMUNITY)
Admission: AD | Admit: 2016-12-08 | Discharge: 2016-12-08 | Disposition: A | Discharge status: Home / Self Care | Payer: 59 | Source: Ambulatory Visit | Attending: Obstetrics | Admitting: Obstetrics

## 2016-12-08 DIAGNOSIS — R109 Unspecified abdominal pain: Secondary | ICD-10-CM | POA: Diagnosis not present

## 2016-12-08 DIAGNOSIS — Z6791 Unspecified blood type, Rh negative: Secondary | ICD-10-CM | POA: Diagnosis not present

## 2016-12-08 DIAGNOSIS — O3411 Maternal care for benign tumor of corpus uteri, first trimester: Secondary | ICD-10-CM | POA: Diagnosis not present

## 2016-12-08 DIAGNOSIS — O98811 Other maternal infectious and parasitic diseases complicating pregnancy, first trimester: Secondary | ICD-10-CM | POA: Insufficient documentation

## 2016-12-08 DIAGNOSIS — B373 Candidiasis of vulva and vagina: Secondary | ICD-10-CM | POA: Insufficient documentation

## 2016-12-08 DIAGNOSIS — D259 Leiomyoma of uterus, unspecified: Secondary | ICD-10-CM

## 2016-12-08 DIAGNOSIS — M791 Myalgia: Secondary | ICD-10-CM | POA: Diagnosis not present

## 2016-12-08 DIAGNOSIS — M7918 Myalgia, other site: Secondary | ICD-10-CM

## 2016-12-08 DIAGNOSIS — O26899 Other specified pregnancy related conditions, unspecified trimester: Secondary | ICD-10-CM

## 2016-12-08 DIAGNOSIS — Z3A12 12 weeks gestation of pregnancy: Secondary | ICD-10-CM | POA: Diagnosis not present

## 2016-12-08 DIAGNOSIS — Z3400 Encounter for supervision of normal first pregnancy, unspecified trimester: Secondary | ICD-10-CM

## 2016-12-08 DIAGNOSIS — B3731 Acute candidiasis of vulva and vagina: Secondary | ICD-10-CM

## 2016-12-08 DIAGNOSIS — O26891 Other specified pregnancy related conditions, first trimester: Secondary | ICD-10-CM | POA: Diagnosis not present

## 2016-12-08 HISTORY — DX: Benign neoplasm of connective and other soft tissue, unspecified: D21.9

## 2016-12-08 HISTORY — DX: Unspecified abnormal cytological findings in specimens from vagina: R87.629

## 2016-12-08 HISTORY — DX: Irritable bowel syndrome, unspecified: K58.9

## 2016-12-08 HISTORY — DX: Dermatitis, unspecified: L30.9

## 2016-12-08 LAB — URINALYSIS, ROUTINE W REFLEX MICROSCOPIC
BILIRUBIN URINE: NEGATIVE
Glucose, UA: NEGATIVE mg/dL
KETONES UR: 20 mg/dL — AB
NITRITE: NEGATIVE
PH: 5 (ref 5.0–8.0)
Protein, ur: 30 mg/dL — AB
Specific Gravity, Urine: 1.025 (ref 1.005–1.030)

## 2016-12-08 LAB — WET PREP, GENITAL
Clue Cells Wet Prep HPF POC: NONE SEEN
Sperm: NONE SEEN
Trich, Wet Prep: NONE SEEN

## 2016-12-08 MED ORDER — TERCONAZOLE 0.4 % VA CREA: 1.0000 | TOPICAL_CREAM | Freq: Every day | VAGINAL | 0 refills | Status: DC

## 2016-12-08 MED ORDER — CYCLOBENZAPRINE HCL 10 MG PO TABS: 10.0000 mg | ORAL_TABLET | Freq: Three times a day (TID) | ORAL | 0 refills | Status: DC | PRN

## 2016-12-08 MED FILL — TERCONAZOLE 0.4% VAG CREAM: 0.4 | 7 days supply | Qty: 45 | Fill #0

## 2016-12-08 MED FILL — CYCLOBENZAPRINE 10 MG TAB: 10 | 10 days supply | Qty: 30 | Fill #0

## 2016-12-08 NOTE — MAU Note (Signed)
Monitor tracing from [phone] to [phone]; tracing MRN [id number]

## 2016-12-08 NOTE — Discharge Instructions (Signed)
Because of Uterine fibroids, please start compression hose, wear daily to prevent blood from pooling in your legs/feet.    Try rest, ice, heat/warm bath, Tylenol for pain.

## 2016-12-08 NOTE — MAU Provider Note (Signed)
Chief Complaint: Abdominal Pain   First Provider Initiated Contact with Patient 12/08/16 [phone]      SUBJECTIVE HPI: Ana Davis is a 35 y.o. G1P0000 at [redacted]w[redacted]d by LMP who presents to maternity admissions reporting abdominal pain with onset 6 days ago, after her prenatal visit on Wednesday, that is constant, severe, and worsens when walking. She also reports thick white discharge with no itching or odor.  She has known fibroids, largest 9 cm on right side.  The pain is constant but increases significantly when she is walking. When walking, or sometimes when resting she had sharp shooting pain that radiates from her mid abdomen down her right leg.  She denies any swelling, warmth, or redness in either leg.  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  She has tried rest, heat, Tylenol, but nothing is helping. She denies vaginal bleeding, vaginal itching/burning, urinary symptoms, h/a, dizziness, vomiting, or fever/chills.     HPI  Past Medical History:  Diagnosis Date  . Asthma    childhood  . Eczema   . Fibroid   . Genital warts 2013   tx with aldara generic  . IBS (irritable bowel syndrome)   . Vaginal Pap smear, abnormal    Past Surgical History:  Procedure Laterality Date  . COLONOSCOPY  08/2015  . NO PAST SURGERIES     Social History   Social History  . Marital status: Married    Spouse name: N/A  . Number of children: N/A  . Years of education: N/A   Occupational History  . Not on file.   Social History Main Topics  . Smoking status: Never Smoker  . Smokeless tobacco: Never Used  . Alcohol use Yes     Comment: social  . Drug use: No  . Sexual activity: Yes    Partners: Male    Birth control/ protection: None   Other Topics Concern  . Not on file   Social History Narrative  . No narrative on file   No current facility-administered medications on file prior to encounter.    Current Outpatient Prescriptions on File Prior to Encounter   Medication Sig Dispense Refill  . Prenatal Vit-Fe Fumarate-FA (MULTIVITAMIN-PRENATAL) 27-0.8 MG TABS tablet Take 1 tablet by mouth daily at 12 noon.     Allergies  Allergen Reactions  . Hydrocodone     Hallucinations    ROS:  Review of Systems  Constitutional: Negative for chills, fatigue and fever.  Respiratory: Negative for shortness of breath.   Cardiovascular: Negative for chest pain.  Gastrointestinal: Positive for nausea. Negative for vomiting.  Genitourinary: Positive for pelvic pain and vaginal discharge. Negative for difficulty urinating, dysuria, flank pain, vaginal bleeding and vaginal pain.  Neurological: Negative for dizziness and headaches.  Psychiatric/Behavioral: Negative.      I have reviewed patient's Past Medical Hx, Surgical Hx, Family Hx, Social Hx, medications and allergies.   Physical Exam   Patient Vitals for the past 24 hrs:  BP Temp Temp src Pulse Resp Height Weight  12/08/16 0809 (!) 96/48 - - 84 - - -  12/08/16 0807 (!) 103/49 98 F (36.7 C) Oral 80 16 4\' 11"  (1.499 m) 130 lb (59 kg)   Constitutional: Well-developed, well-nourished female in no acute distress.  Cardiovascular: normal rate Respiratory: normal effort GI: Abd soft, fundus slightly above umbilicus, mild tenderness across fundus and to right side of uterus, uterus irregular in shape. Pos BS x 4 MS: Extremities nontender,  no edema, calves same size bilaterally, no edema, warmth, or erythema, normal ROM Neurologic: Alert and oriented x 4.  GU: Neg CVAT.  PELVIC EXAM: Cervix pink, visually closed, without lesion, moderate amount thick white curd-like discharge mostly white with some tan color on collected swab, vaginal walls and external genitalia normal Bimanual exam: Cervix 0/long/high, firm, anterior, neg CMT, uterus tender, enlarged to ~20 week size and irregular in shape, adnexa without tenderness, enlargement, or mass   LAB RESULTS Results for orders placed or performed during  the hospital encounter of 12/08/16 (from the past 24 hour(s))  Urinalysis, Routine w reflex microscopic     Status: Abnormal   Collection Time: 12/08/16  8:05 AM  Result Value Ref Range   Color, Urine YELLOW YELLOW   APPearance HAZY (A) CLEAR   Specific Gravity, Urine 1.025 1.005 - 1.030   pH 5.0 5.0 - 8.0   Glucose, UA NEGATIVE NEGATIVE mg/dL   Hgb urine dipstick SMALL (A) NEGATIVE   Bilirubin Urine NEGATIVE NEGATIVE   Ketones, ur 20 (A) NEGATIVE mg/dL   Protein, ur 30 (A) NEGATIVE mg/dL   Nitrite NEGATIVE NEGATIVE   Leukocytes, UA LARGE (A) NEGATIVE   RBC / HPF 6-30 0 - 5 RBC/hpf   WBC, UA 6-30 0 - 5 WBC/hpf   Bacteria, UA RARE (A) NONE SEEN   Squamous Epithelial / LPF 0-5 (A) NONE SEEN   Mucous PRESENT   Wet prep, genital     Status: Abnormal   Collection Time: 12/08/16  9:31 AM  Result Value Ref Range   Yeast Wet Prep HPF POC PRESENT (A) NONE SEEN   Trich, Wet Prep NONE SEEN NONE SEEN   Clue Cells Wet Prep HPF POC NONE SEEN NONE SEEN   WBC, Wet Prep HPF POC MANY (A) NONE SEEN   Sperm NONE SEEN     O/NEG/-- (10/17 1417)  IMAGING  Bedside US reveals IUP with normal FHR, subjectively normal amniotic fluid, and normal placenta with visible fetal movement.  Several fibroids visible, causing enlarged and irregular shape of uterus.  MAU Management/MDM: Ordered labs and reviewed results.  Consult [facility].  Plan to treat musculoskeletal pain with Flexeril 5-10 mg PO TID PRN. Treat vaginal yeast infection with Terazol 7. Note given for pt to miss one night of work, return tomorrow.  Increase PO fluids. Pt to f/u in office and return to MAU as needed for emergencies. Pt stable at time of discharge.  ASSESSMENT 1. Vaginal yeast infection   2. Rh negative, antepartum   3. Supervision of normal first pregnancy, antepartum   4. Abdominal pain during pregnancy, first trimester   5. Uterine leiomyoma, unspecified location   6. Musculoskeletal pain     PLAN Discharge  home Reviewed s/sx of DVT with pt/reasons to be evaluated    Medication List    TAKE these medications   cyclobenzaprine 10 MG tablet Commonly known as:  FLEXERIL Take 1 tablet (10 mg total) by mouth every 8 (eight) hours as needed for muscle spasms.   Ginger Root 550 MG Caps Take 1,100 capsules by mouth daily.   multivitamin-prenatal 27-0.8 MG Tabs tablet Take 1 tablet by mouth daily at 12 noon.   terconazole 0.4 % vaginal cream Commonly known as:  TERAZOL 7 Place 1 applicator vaginally at bedtime.      Follow-up Information    Ala Dach., MD Follow up.   Specialty:  Obstetrics and Gynecology Why:  As scheduled, sooner as needed for worsening pain. Return to  MAU as needed for emergencies. Contact information: [place] [address] [place] Certified Nurse-Midwife 12/08/2016  10:56 AM

## 2016-12-08 NOTE — MAU Note (Signed)
Last year at annual was dx with fibroids. Uterus is very enlarged at this time with preg, several fibroids. Was just seen on Wed.  Nausea has started.  Since was seen, has started having pain -feels like it is constantly in a contraction. Pain is on rt side, pain is severe, shoots down into thigh

## 2016-12-08 NOTE — MAU Note (Deleted)
Started leaking clear fluid yesterday around 6 p.m.Hulen Skains office last night while at work, was told to come in .  Fluid continues to come, also having contractions.

## 2016-12-09 LAB — GC/CHLAMYDIA PROBE AMP (~~LOC~~) NOT AT ARMC
Chlamydia: NEGATIVE
Neisseria Gonorrhea: NEGATIVE

## 2016-12-10 MED FILL — OXYCODONE W/APAP 5/325 TAB: 5-325 | 4 days supply | Qty: 15 | Fill #0

## 2016-12-14 ENCOUNTER — Other Ambulatory Visit (HOSPITAL_COMMUNITY): Payer: Self-pay | Admitting: Obstetrics & Gynecology

## 2016-12-14 DIAGNOSIS — O283 Abnormal ultrasonic finding on antenatal screening of mother: Secondary | ICD-10-CM

## 2016-12-14 DIAGNOSIS — Z3A13 13 weeks gestation of pregnancy: Secondary | ICD-10-CM

## 2016-12-15 ENCOUNTER — Other Ambulatory Visit (HOSPITAL_COMMUNITY): Payer: Self-pay | Admitting: Obstetrics & Gynecology

## 2016-12-15 ENCOUNTER — Ambulatory Visit (HOSPITAL_COMMUNITY)
Admission: RE | Admit: 2016-12-15 | Discharge: 2016-12-15 | Disposition: A | Discharge status: Home / Self Care | Payer: 59 | Source: Ambulatory Visit | Attending: Obstetrics & Gynecology | Admitting: Obstetrics & Gynecology

## 2016-12-15 ENCOUNTER — Encounter (HOSPITAL_COMMUNITY): Payer: Self-pay

## 2016-12-15 DIAGNOSIS — Z3A13 13 weeks gestation of pregnancy: Secondary | ICD-10-CM | POA: Diagnosis not present

## 2016-12-15 DIAGNOSIS — O283 Abnormal ultrasonic finding on antenatal screening of mother: Secondary | ICD-10-CM

## 2016-12-15 DIAGNOSIS — O09521 Supervision of elderly multigravida, first trimester: Secondary | ICD-10-CM | POA: Insufficient documentation

## 2016-12-15 DIAGNOSIS — O09529 Supervision of elderly multigravida, unspecified trimester: Secondary | ICD-10-CM

## 2016-12-15 DIAGNOSIS — Z6791 Unspecified blood type, Rh negative: Secondary | ICD-10-CM

## 2016-12-15 DIAGNOSIS — O281 Abnormal biochemical finding on antenatal screening of mother: Secondary | ICD-10-CM | POA: Diagnosis not present

## 2016-12-15 DIAGNOSIS — Z3682 Encounter for antenatal screening for nuchal translucency: Secondary | ICD-10-CM | POA: Insufficient documentation

## 2016-12-15 DIAGNOSIS — D259 Leiomyoma of uterus, unspecified: Secondary | ICD-10-CM | POA: Insufficient documentation

## 2016-12-15 DIAGNOSIS — O3411 Maternal care for benign tumor of corpus uteri, first trimester: Secondary | ICD-10-CM | POA: Diagnosis not present

## 2016-12-15 DIAGNOSIS — Z3400 Encounter for supervision of normal first pregnancy, unspecified trimester: Secondary | ICD-10-CM

## 2016-12-15 DIAGNOSIS — Z3A12 12 weeks gestation of pregnancy: Secondary | ICD-10-CM | POA: Insufficient documentation

## 2016-12-15 DIAGNOSIS — Z36 Encounter for antenatal screening for chromosomal anomalies: Secondary | ICD-10-CM | POA: Diagnosis not present

## 2016-12-15 DIAGNOSIS — O26899 Other specified pregnancy related conditions, unspecified trimester: Secondary | ICD-10-CM

## 2016-12-16 ENCOUNTER — Other Ambulatory Visit (HOSPITAL_COMMUNITY): Payer: Self-pay | Admitting: *Deleted

## 2016-12-16 ENCOUNTER — Ambulatory Visit (HOSPITAL_COMMUNITY): Payer: 59

## 2016-12-16 DIAGNOSIS — O285 Abnormal chromosomal and genetic finding on antenatal screening of mother: Secondary | ICD-10-CM

## 2016-12-17 DIAGNOSIS — Z36 Encounter for antenatal screening for chromosomal anomalies: Secondary | ICD-10-CM | POA: Insufficient documentation

## 2016-12-17 DIAGNOSIS — Z3A13 13 weeks gestation of pregnancy: Secondary | ICD-10-CM | POA: Insufficient documentation

## 2016-12-17 NOTE — Progress Notes (Signed)
Genetic Counseling  High-Risk Gestation Note  Appointment Date:  12/15/2016 Referred By: Ana Fallen, MD Date of Birth:  24-May-1981 Partner:  Ana Davis   Pregnancy History: G1P0000 Estimated Date of Delivery: 06/19/17 Estimated Gestational Age: [redacted]w[redacted]d Attending: Benjaman Lobe, MD   [name] and her husband, Mr. Ana Davis, were seen for genetic counseling because of a maternal age of 35 y.o. and increased risk for XXY from noninvasive prenatal screening (NIPS).   In summary:  Discussed AMA and associated risk for fetal aneuploidy  We reviewed results of NIPS (InformaSeq) performed through her OB office  High risk XXY; Positive predictive value approximately 37%  Low risk for trisomies 21, 18, and 13  Reviewed variable features of XXY (Klinefelter)  Given that the couple felt the PPV was relatively low, they declined written information regarding this condition today  Discussed options for follow-up testing   Ultrasound - reviewed limitations in assessing for XXY  Maternal karyotype analysis - declined today  NIPS via different methodology (Panorama)- declined  Amniocentesis - declined  Postnatal chromosome analysis - couple would prefer to pursue this option  Reviewed family history concerns  Discussed carrier screening options   They were counseled regarding maternal age and the association with risk for chromosome conditions due to nondisjunction with aging of the ova.   We reviewed chromosomes, nondisjunction, and the associated 1 in 76 risk for fetal aneuploidy related to a maternal age of 35 y.o. at [redacted]w[redacted]d gestation.  They were counseled that the risk for aneuploidy decreases as gestational age increases, accounting for those pregnancies which spontaneously abort.  We specifically discussed Down syndrome (trisomy 24), trisomies 70 and 87, and sex chromosome aneuploidies (47,XXX and 47,XXY) including the common features and  prognoses of each.   [name] had noninvasive prenatal screening (NIPS)/cfDNA (cell free DNA) performed through her OB office. Specifically, she had InformaSeq through [employer], which resulted as high risk for XXY (Klinefelter syndrome). We discussed that this testing identifies ~92-95% of pregnancies with sex chromosome abnormalities with a false positive rate of ~1%. We reviewed that this testing is highly sensitive and specific, but is not considered diagnostic. We also reviewed that the positive predictive value (the chance that a positive result is a true positive result) is not the same as the detection rate and that this number is actually far below 80% in women who are 42 and older. We discussed that this result is associated with an approximate 37% positive predictive value for XXY in the current pregnancy. We discussed that results are based on the assumption that maternal chromosomes are normal (46,XX); however, they were counseled that maternal cells can have mitotic nondisjunction changes (age related) that can influence the results and the accuracy of this technology. Additionally, we review that the cell free DNA test can not distinguish between aneuploidy confined to the placenta, fetal XXY, or mosaicism (fetal or placental).   Considering the possibility of Klinefelter syndrome, they were counseled in detail regarding this diagnosis. We reviewed chromosomes, nondisjunction, and that chromosome division errors happen by chance and are not usually inherited. The first 22 chromosome pairs are the autosomes and typically are the same in males and females. The 23rd pair is referred to as the sex chromosomes. Typically females have two X chromosomes, and males typically have one X and one Y chromosome. Klinefelter syndrome occurs as the result of an additional X chromosome in males, denoted as 12, XXY.   They were counseled that Klinefelter  syndrome is the most common sex chromosome  condition, with a prevalence of approximately 1 in 23 males. Klinefelter syndrome is most commonly ascertained either by prenatal diagnosis, or during a work-up for delayed puberty or infertility. We discussed that the features of Klinefelter are variable and are age-dependent. Newborns with Klinefelter syndrome have relatively few, if any distinguishing characteristics, although the risk of hypotonia is increased and may be noticeable shortly after birth. Characteristics in childhood and into adulthood may include tall stature, testicular failure, azoospermia, and lack of pubertal virilization. Individuals with 47,XXY typically have a unique cognitive and behavioral profile, which may include an increased risk of learning disabilities (dyslexia and attention-deficit hyperactivity disorder), differences in language development, and social/emotional problems. We discussed that early intervention services can be effective in reducing many of these issues. Additionally, testosterone replacement therapy has also been shown to be effective in alleviating certain behavioral and physical (pubertal) concerns. Literature reports that infertility is almost always inevitable for males with 47,XXY, outside of medical intervention due to azoospermia or oligospermia. Many males with Klinefelter syndrome have gone on to have successful pregnancies, but in most cases these pregnancies have required assisted reproductive therapies.   We discussed the availability of amniocentesis (after [redacted] weeks gestation) for chromosome analysis in the pregnancy. We discussed the risks, benefits, and limitations including the associated 1 in [id number] risk for complications from amniocentesis, including spontaneous pregnancy loss. They understand that this testing would not diagnose or rule out all genetic conditions or birth defects. Additionally, we discussed the option of postnatal chromosome analysis either by sampling of the cord blood or  peripheral blood to confirm or rule out the NIPS result. We discussed that detailed ultrasound would likely not be helpful in adjusting the risk of Klinefelter syndrome in the pregnancy, given that fetuses with Klinefelter syndrome typically do not have major anatomic differences or markers for aneuploidy.  We also discussed the option of performing NIPS through a different laboratory (one which looks at unique DNA sequences to determine sex chromosome count). Given the technology used by the NIPS performed for the patient (InformaSeq), we offered maternal peripheral blood chromosome analysis to assess for maternal X chromosome aneuploidy. After careful consideration, Ana Davis declined NIPS through a different laboratory today, peripheral blood chromosome analysis for herself, and amniocentesis. The couple stated they were comfortable with the approximate 37% PPV from the NIPS result and would prefer to pursue postnatal chromosome analysis.    Nuchal translucency ultrasound was attempted today but was unable to be obtained. Complete report under separate cover. Detailed ultrasound is scheduled for 01/21/17. They understand that screening tests cannot rule out all birth defects or genetic syndromes. The patient was advised of this limitation and states she still does not want additional testing at this time.   Ana Davis  was provided with written information regarding cystic fibrosis (CF), spinal muscular atrophy (SMA) and hemoglobinopathies including the carrier frequency, availability of carrier screening and prenatal diagnosis if indicated.  In addition, we discussed that CF and hemoglobinopathies are routinely screened for as part of the [place] newborn screening panel.  CF carrier screening and hemoglobin electrophoresis were previously performed through her primary care provider and were within normal range, thus, reducing her risk to be a carrier for these conditions. After further discussion,  she declined screening for SMA.  Both family histories were reviewed and found to be contributory for sickle cell disease for Ana Davis' female paternal first cousin. Ana Davis reported that he  has not been screened for sickle cell carrier status to his knowledge. We reviewed the autosomal recessive inheritance of sickle cell disease. Prior to carrier screening for Ana Davis, his risk to have sickle cell trait would be approximately 1 in 4, given the reported family history. We reviewed that Ana Davis had hemoglobin electrophoresis, which indicated the presence of normal adult hemoglobin (Hb AA).  Thus, the risk for sickle cell disease in the current pregnancy would be expected to be low. Without further information regarding the provided family history, an accurate genetic risk cannot be calculated. Further genetic counseling is warranted if more information is obtained.  [name] denied exposure to environmental toxins or chemical agents. She denied the use of alcohol, tobacco or street drugs. She denied significant viral illnesses during the course of her pregnancy. Her medical and surgical histories were noncontributory.   I counseled this couple regarding the above risks and available options.  The approximate face-to-face time with the genetic counselor was 45 minutes.  Chipper Oman, MS,  Certified Genetic Counselor 12/17/2016

## 2016-12-30 DIAGNOSIS — Z361 Encounter for antenatal screening for raised alphafetoprotein level: Secondary | ICD-10-CM | POA: Diagnosis not present

## 2017-01-01 ENCOUNTER — Other Ambulatory Visit (HOSPITAL_COMMUNITY): Payer: Self-pay

## 2017-01-21 ENCOUNTER — Encounter (HOSPITAL_COMMUNITY): Payer: Self-pay

## 2017-01-21 ENCOUNTER — Ambulatory Visit (HOSPITAL_COMMUNITY)
Admission: RE | Admit: 2017-01-21 | Discharge: 2017-01-21 | Disposition: A | Discharge status: Home / Self Care | Payer: 59 | Source: Ambulatory Visit | Attending: Obstetrics & Gynecology | Admitting: Obstetrics & Gynecology

## 2017-01-21 DIAGNOSIS — O3412 Maternal care for benign tumor of corpus uteri, second trimester: Secondary | ICD-10-CM | POA: Diagnosis not present

## 2017-01-21 DIAGNOSIS — O09512 Supervision of elderly primigravida, second trimester: Secondary | ICD-10-CM | POA: Insufficient documentation

## 2017-01-21 DIAGNOSIS — Z3A18 18 weeks gestation of pregnancy: Secondary | ICD-10-CM | POA: Diagnosis not present

## 2017-01-21 DIAGNOSIS — Z3689 Encounter for other specified antenatal screening: Secondary | ICD-10-CM | POA: Diagnosis not present

## 2017-01-21 DIAGNOSIS — O285 Abnormal chromosomal and genetic finding on antenatal screening of mother: Secondary | ICD-10-CM | POA: Insufficient documentation

## 2017-01-21 DIAGNOSIS — Z363 Encounter for antenatal screening for malformations: Secondary | ICD-10-CM | POA: Diagnosis not present

## 2017-01-21 DIAGNOSIS — D259 Leiomyoma of uterus, unspecified: Secondary | ICD-10-CM | POA: Insufficient documentation

## 2017-01-29 MED FILL — FLUCONAZOLE 150 MG TABLET: 150 | 1 days supply | Qty: 1 | Fill #0

## 2017-03-24 DIAGNOSIS — O360120 Maternal care for anti-D [Rh] antibodies, second trimester, not applicable or unspecified: Secondary | ICD-10-CM | POA: Diagnosis not present

## 2017-03-24 DIAGNOSIS — O24410 Gestational diabetes mellitus in pregnancy, diet controlled: Secondary | ICD-10-CM | POA: Diagnosis not present

## 2017-03-24 DIAGNOSIS — O3412 Maternal care for benign tumor of corpus uteri, second trimester: Secondary | ICD-10-CM | POA: Diagnosis not present

## 2017-03-24 DIAGNOSIS — O99282 Endocrine, nutritional and metabolic diseases complicating pregnancy, second trimester: Secondary | ICD-10-CM | POA: Diagnosis not present

## 2017-03-24 DIAGNOSIS — Z3A27 27 weeks gestation of pregnancy: Secondary | ICD-10-CM | POA: Diagnosis not present

## 2017-03-24 DIAGNOSIS — Z3689 Encounter for other specified antenatal screening: Secondary | ICD-10-CM | POA: Diagnosis not present

## 2017-03-24 DIAGNOSIS — Z23 Encounter for immunization: Secondary | ICD-10-CM | POA: Diagnosis not present

## 2017-04-01 MED FILL — FREESTYLE LANCETS: 25 days supply | Qty: 100 | Fill #0

## 2017-04-01 MED FILL — FREESTYLE LITE METER: 30 days supply | Qty: 1 | Fill #0

## 2017-04-01 MED FILL — FREESTYLE LITE TEST STRIP: 25 days supply | Qty: 100 | Fill #0

## 2017-04-08 DIAGNOSIS — Z3689 Encounter for other specified antenatal screening: Secondary | ICD-10-CM | POA: Diagnosis not present

## 2017-04-21 ENCOUNTER — Other Ambulatory Visit (HOSPITAL_COMMUNITY): Payer: Self-pay | Admitting: Obstetrics & Gynecology

## 2017-04-21 DIAGNOSIS — Z3A32 32 weeks gestation of pregnancy: Secondary | ICD-10-CM

## 2017-04-21 DIAGNOSIS — Q998 Other specified chromosome abnormalities: Secondary | ICD-10-CM

## 2017-04-21 DIAGNOSIS — O283 Abnormal ultrasonic finding on antenatal screening of mother: Secondary | ICD-10-CM

## 2017-04-29 ENCOUNTER — Ambulatory Visit (HOSPITAL_COMMUNITY): Payer: 59

## 2017-05-03 ENCOUNTER — Ambulatory Visit (HOSPITAL_COMMUNITY)
Admission: RE | Admit: 2017-05-03 | Discharge: 2017-05-03 | Disposition: A | Discharge status: Home / Self Care | Payer: 59 | Source: Ambulatory Visit | Attending: Obstetrics & Gynecology | Admitting: Obstetrics & Gynecology

## 2017-05-03 ENCOUNTER — Encounter (HOSPITAL_COMMUNITY): Payer: Self-pay

## 2017-05-03 ENCOUNTER — Other Ambulatory Visit (HOSPITAL_COMMUNITY): Payer: Self-pay | Admitting: Obstetrics & Gynecology

## 2017-05-03 ENCOUNTER — Other Ambulatory Visit: Payer: Self-pay

## 2017-05-03 DIAGNOSIS — O285 Abnormal chromosomal and genetic finding on antenatal screening of mother: Secondary | ICD-10-CM

## 2017-05-03 DIAGNOSIS — Z3A33 33 weeks gestation of pregnancy: Secondary | ICD-10-CM | POA: Insufficient documentation

## 2017-05-03 DIAGNOSIS — O09513 Supervision of elderly primigravida, third trimester: Secondary | ICD-10-CM | POA: Insufficient documentation

## 2017-05-03 DIAGNOSIS — Q998 Other specified chromosome abnormalities: Secondary | ICD-10-CM

## 2017-05-03 DIAGNOSIS — Z3A32 32 weeks gestation of pregnancy: Secondary | ICD-10-CM

## 2017-05-03 DIAGNOSIS — O3413 Maternal care for benign tumor of corpus uteri, third trimester: Secondary | ICD-10-CM | POA: Diagnosis not present

## 2017-05-03 DIAGNOSIS — D259 Leiomyoma of uterus, unspecified: Secondary | ICD-10-CM

## 2017-05-03 DIAGNOSIS — O283 Abnormal ultrasonic finding on antenatal screening of mother: Secondary | ICD-10-CM | POA: Diagnosis not present

## 2017-05-03 DIAGNOSIS — O365930 Maternal care for other known or suspected poor fetal growth, third trimester, not applicable or unspecified: Secondary | ICD-10-CM | POA: Diagnosis not present

## 2017-05-07 ENCOUNTER — Encounter (HOSPITAL_COMMUNITY): Payer: Self-pay

## 2017-05-19 ENCOUNTER — Encounter: Payer: Self-pay | Admitting: Registered"

## 2017-05-19 ENCOUNTER — Encounter: Payer: 59 | Attending: Obstetrics & Gynecology | Admitting: Registered"

## 2017-05-19 DIAGNOSIS — Z713 Dietary counseling and surveillance: Secondary | ICD-10-CM | POA: Insufficient documentation

## 2017-05-19 DIAGNOSIS — O99810 Abnormal glucose complicating pregnancy: Secondary | ICD-10-CM | POA: Insufficient documentation

## 2017-05-19 DIAGNOSIS — R7309 Other abnormal glucose: Secondary | ICD-10-CM

## 2017-05-19 NOTE — Progress Notes (Signed)
Patient was seen on 05/19/2017 for Gestational Diabetes self-management class at the [facility]. The following learning objectives were met by the patient during this course:   States the definition of Gestational Diabetes  States why dietary management is important in controlling blood glucose  Describes the effects each nutrient has on blood glucose levels  Demonstrates ability to create a balanced meal plan  Demonstrates carbohydrate counting   States when to check blood glucose levels  Demonstrates proper blood glucose monitoring techniques  States the effect of stress and exercise on blood glucose levels  States the importance of limiting caffeine and abstaining from alcohol and smoking  Blood glucose monitor given: pt has own meter Lot # n/a Exp: n/a Blood glucose reading: 91  Patient instructed to monitor glucose levels: FBS: 60 - <90 1 hour: <140 2 hour: <120  Patient received handouts:  Nutrition Diabetes and Pregnancy  Carbohydrate Counting List  Patient will be seen for follow-up as needed.

## 2017-05-20 DIAGNOSIS — O3413 Maternal care for benign tumor of corpus uteri, third trimester: Secondary | ICD-10-CM | POA: Diagnosis not present

## 2017-05-20 DIAGNOSIS — Z3685 Encounter for antenatal screening for Streptococcus B: Secondary | ICD-10-CM | POA: Diagnosis not present

## 2017-05-20 DIAGNOSIS — O351XX0 Maternal care for (suspected) chromosomal abnormality in fetus, not applicable or unspecified: Secondary | ICD-10-CM | POA: Diagnosis not present

## 2017-05-20 DIAGNOSIS — Z3A35 35 weeks gestation of pregnancy: Secondary | ICD-10-CM | POA: Diagnosis not present

## 2017-05-25 ENCOUNTER — Other Ambulatory Visit: Payer: Self-pay | Admitting: Obstetrics & Gynecology

## 2017-05-25 DIAGNOSIS — O3413 Maternal care for benign tumor of corpus uteri, third trimester: Secondary | ICD-10-CM | POA: Diagnosis not present

## 2017-05-25 DIAGNOSIS — Z3A36 36 weeks gestation of pregnancy: Secondary | ICD-10-CM | POA: Diagnosis not present

## 2017-05-25 DIAGNOSIS — O99013 Anemia complicating pregnancy, third trimester: Secondary | ICD-10-CM | POA: Diagnosis not present

## 2017-06-02 ENCOUNTER — Encounter (HOSPITAL_COMMUNITY): Payer: Self-pay

## 2017-06-03 DIAGNOSIS — O3413 Maternal care for benign tumor of corpus uteri, third trimester: Secondary | ICD-10-CM | POA: Diagnosis not present

## 2017-06-03 DIAGNOSIS — Z3A37 37 weeks gestation of pregnancy: Secondary | ICD-10-CM | POA: Diagnosis not present

## 2017-06-03 DIAGNOSIS — O99013 Anemia complicating pregnancy, third trimester: Secondary | ICD-10-CM | POA: Diagnosis not present

## 2017-06-09 DIAGNOSIS — O3413 Maternal care for benign tumor of corpus uteri, third trimester: Secondary | ICD-10-CM | POA: Diagnosis not present

## 2017-06-09 DIAGNOSIS — Z3A38 38 weeks gestation of pregnancy: Secondary | ICD-10-CM | POA: Diagnosis not present

## 2017-06-11 ENCOUNTER — Encounter (HOSPITAL_COMMUNITY)
Admission: RE | Admit: 2017-06-11 | Discharge: 2017-06-11 | Disposition: A | Discharge status: Home / Self Care | Payer: 59 | Source: Ambulatory Visit | Attending: Obstetrics & Gynecology | Admitting: Obstetrics & Gynecology

## 2017-06-11 DIAGNOSIS — O358XX0 Maternal care for other (suspected) fetal abnormality and damage, not applicable or unspecified: Secondary | ICD-10-CM | POA: Diagnosis not present

## 2017-06-11 DIAGNOSIS — Z6791 Unspecified blood type, Rh negative: Secondary | ICD-10-CM | POA: Diagnosis not present

## 2017-06-11 DIAGNOSIS — O321XX0 Maternal care for breech presentation, not applicable or unspecified: Secondary | ICD-10-CM | POA: Diagnosis not present

## 2017-06-11 DIAGNOSIS — O24420 Gestational diabetes mellitus in childbirth, diet controlled: Secondary | ICD-10-CM | POA: Diagnosis not present

## 2017-06-11 DIAGNOSIS — O26893 Other specified pregnancy related conditions, third trimester: Secondary | ICD-10-CM | POA: Diagnosis not present

## 2017-06-11 DIAGNOSIS — D259 Leiomyoma of uterus, unspecified: Secondary | ICD-10-CM | POA: Diagnosis not present

## 2017-06-11 DIAGNOSIS — O99824 Streptococcus B carrier state complicating childbirth: Secondary | ICD-10-CM | POA: Diagnosis not present

## 2017-06-11 DIAGNOSIS — O3413 Maternal care for benign tumor of corpus uteri, third trimester: Secondary | ICD-10-CM | POA: Diagnosis not present

## 2017-06-11 DIAGNOSIS — D62 Acute posthemorrhagic anemia: Secondary | ICD-10-CM | POA: Diagnosis not present

## 2017-06-11 HISTORY — DX: Gestational diabetes mellitus in pregnancy, unspecified control: O24.419

## 2017-06-11 HISTORY — DX: Anxiety disorder, unspecified: F41.9

## 2017-06-11 LAB — CBC
HCT: 33.3 % — ABNORMAL LOW (ref 36.0–46.0)
Hemoglobin: 11.2 g/dL — ABNORMAL LOW (ref 12.0–15.0)
MCH: 32.1 pg (ref 26.0–34.0)
MCHC: 33.6 g/dL (ref 30.0–36.0)
MCV: 95.4 fL (ref 78.0–100.0)
PLATELETS: 213 10*3/uL (ref 150–400)
RBC: 3.49 MIL/uL — ABNORMAL LOW (ref 3.87–5.11)
RDW: 14 % (ref 11.5–15.5)
WBC: 7.4 10*3/uL (ref 4.0–10.5)

## 2017-06-11 NOTE — Patient Instructions (Signed)
[place]  06/11/2017   Your procedure is scheduled on:  06/14/2017  Enter through the Main Entrance of [facility] up the phone at the desk and dial [phone].   Call this number if you have problems the morning of surgery: [phone]   Remember:   Do not eat food:After Midnight.  Do not drink clear liquids: After Midnight.  Take these medicines the morning of surgery with A SIP OF WATER: none   Do not wear jewelry, make-up or nail polish.  Do not wear lotions, powders, or perfumes. Do not wear deodorant.  Do not shave 48 hours prior to surgery.  Do not bring valuables to the hospital.  [facility] is not   responsible for any belongings or valuables brought to the hospital.  Contacts, dentures or bridgework may not be worn into surgery.  Leave suitcase in the car. After surgery it may be brought to your room.  For patients admitted to the hospital, checkout time is 11:00 AM the day of              discharge.   Patients discharged the day of surgery will not be allowed to drive             home.  Name and phone number of your driver: na  Special Instructions:   N/A   Please read over the following fact sheets that you were given:   Surgical Site Infection Prevention

## 2017-06-12 LAB — RPR: RPR Ser Ql: NONREACTIVE

## 2017-06-13 ENCOUNTER — Encounter (HOSPITAL_COMMUNITY): Payer: Self-pay | Admitting: Obstetrics & Gynecology

## 2017-06-13 DIAGNOSIS — O321XX0 Maternal care for breech presentation, not applicable or unspecified: Secondary | ICD-10-CM | POA: Diagnosis present

## 2017-06-13 DIAGNOSIS — O24410 Gestational diabetes mellitus in pregnancy, diet controlled: Secondary | ICD-10-CM | POA: Diagnosis present

## 2017-06-13 HISTORY — DX: Maternal care for breech presentation, not applicable or unspecified: O32.1XX0

## 2017-06-13 HISTORY — DX: Gestational diabetes mellitus in pregnancy, diet controlled: O24.410

## 2017-06-13 NOTE — H&P (Signed)
Ana Davis is a 36 y.o. female presenting for primary C-section at 69 wks for Breech presentation. She is not a candidate for version due to large fibroids A1GDM, AMA, multi-fibroid uterus, Fetal chromosomal abnormality suspected (XXY), Anemia in pregnancy. GBS(+)  Pt saw MFM for abnormal 1st trim screen- Informaseq/ cfFDNA XXY, diploid 21,13,18.  Patient declined invasive testing. MFM Anatomy sono and 33 wks interval growth- AGA 33% and AC 16%   Antenatal testing NST wkly from 36 wks.    Fibroid pain in pregnancy, time out of work but no admission needed  Rh negative, took Rhogam  OB History    Gravida Para Term Preterm AB Living   1 0 0 0 0 0   SAB TAB Ectopic Multiple Live Births   0 0 0 0       Past Medical History:  Diagnosis Date  . Anxiety    claustrophobic  . Asthma    childhood  . Breech presentation 06/13/2017  . Eczema   . Fibroid   . Genital warts 2013   tx with aldara generic  . Gestational diabetes   . IBS (irritable bowel syndrome)   . Vaginal Pap smear, abnormal    Past Surgical History:  Procedure Laterality Date  . COLONOSCOPY  08/2015  . NO PAST SURGERIES    . WISDOM TOOTH EXTRACTION     Family History: family history includes Arthritis in her paternal grandfather; Cancer in her maternal grandmother; Diabetes in her maternal grandfather; Hypertension in her father; Kidney disease in her maternal grandfather; Seizures in her maternal grandmother. Social History:  reports that she has never smoked. She has never used smokeless tobacco. She reports that she drinks alcohol. She reports that she does not use drugs.     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 Office growth at 28 wks noted AC<10% but >10% with MFM at 33 wks 33% AGA and AC 16%. Last growth sono in office at 36 wks  AGA, AC >10%. Fetal Ultrasounds or other Referrals:  Referred to Materal Fetal Medicine  Maternal Substance Abuse:  No Significant Maternal Medications:  Meds include: Other:  Flexeril in 2nd trim for fibroid pain Significant Maternal Lab Results:  Lab values include: Group B Strep positive, Rh negative Other Comments:  None  ROS fibroid pain in mid trimester  History   Last menstrual period 09/12/2016. Exam Physical Exam  BP 127/75   Pulse 80   Temp 98.3 F (36.8 C) (Oral)   Resp 20   Ht 4\' 11"  (1.499 m)   Wt 148 lb (67.1 kg)   LMP 09/12/2016   BMI 29.89 kg/m   A&O x 3, no acute distress. Pleasant HEENT neg, no thyromegaly Lungs CTA bilat CV RRR, S1S2 normal Abdo soft, non tender, non acute- large fibroids - BREECH presentation  Extr no edema/ tenderness Pelvic def FHT  140 Toco  None   Prenatal labs: ABO, Rh: --/--/O NEG (06/15 1120) Antibody: POS (06/15 1120) Rubella: 2.70 (10/17 1417) RPR: Non Reactive (06/15 1120)  HBsAg: NEGATIVE (10/17 1417)  HIV: NONREACTIVE (10/17 1417)  GBS:   Positive   Assessment/Plan: 36 yo G1 at 39 wks with persistent Breech and large multi-fibroid uterus. Here for Primary C-section.  Anemia.  Abnormal fetal sex chromosomes suspected [id number]    Risks/complications of surgery reviewed incl infection, bleeding, damage to internal organs  including bladder, bowels, ureters, blood vessels, other risks from anesthesia, VTE and delayed complications of any surgery, complications in future surgery reviewed. Also discussed neonatal complications incl difficult delivery, laceration, vacuum assistance, TTN etc. Pt understands and agrees, all concerns addressed.    Ana Davis R 06/13/2017, 6:22 PM

## 2017-06-14 ENCOUNTER — Inpatient Hospital Stay (HOSPITAL_COMMUNITY): Payer: 59 | Admitting: Anesthesiology

## 2017-06-14 ENCOUNTER — Inpatient Hospital Stay (HOSPITAL_COMMUNITY)
Admission: RE | Admit: 2017-06-14 | Discharge: 2017-06-17 | DRG: 765 | Disposition: A | Discharge status: Home / Self Care | Payer: 59 | Source: Ambulatory Visit | Attending: Obstetrics & Gynecology | Admitting: Obstetrics & Gynecology

## 2017-06-14 ENCOUNTER — Encounter (HOSPITAL_COMMUNITY)
Admission: RE | Disposition: A | Discharge status: Home / Self Care | Payer: Self-pay | Source: Ambulatory Visit | Attending: Obstetrics & Gynecology

## 2017-06-14 ENCOUNTER — Encounter (HOSPITAL_COMMUNITY): Payer: Self-pay | Admitting: *Deleted

## 2017-06-14 DIAGNOSIS — F419 Anxiety disorder, unspecified: Secondary | ICD-10-CM | POA: Diagnosis present

## 2017-06-14 DIAGNOSIS — O09529 Supervision of elderly multigravida, unspecified trimester: Secondary | ICD-10-CM

## 2017-06-14 DIAGNOSIS — O99344 Other mental disorders complicating childbirth: Secondary | ICD-10-CM | POA: Diagnosis present

## 2017-06-14 DIAGNOSIS — O9952 Diseases of the respiratory system complicating childbirth: Secondary | ICD-10-CM | POA: Diagnosis present

## 2017-06-14 DIAGNOSIS — D509 Iron deficiency anemia, unspecified: Secondary | ICD-10-CM | POA: Diagnosis present

## 2017-06-14 DIAGNOSIS — D259 Leiomyoma of uterus, unspecified: Secondary | ICD-10-CM | POA: Diagnosis present

## 2017-06-14 DIAGNOSIS — D62 Acute posthemorrhagic anemia: Secondary | ICD-10-CM | POA: Diagnosis not present

## 2017-06-14 DIAGNOSIS — O358XX0 Maternal care for other (suspected) fetal abnormality and damage, not applicable or unspecified: Secondary | ICD-10-CM | POA: Diagnosis present

## 2017-06-14 DIAGNOSIS — Z6791 Unspecified blood type, Rh negative: Secondary | ICD-10-CM

## 2017-06-14 DIAGNOSIS — Z886 Allergy status to analgesic agent status: Secondary | ICD-10-CM | POA: Diagnosis not present

## 2017-06-14 DIAGNOSIS — O24420 Gestational diabetes mellitus in childbirth, diet controlled: Secondary | ICD-10-CM | POA: Diagnosis not present

## 2017-06-14 DIAGNOSIS — O9081 Anemia of the puerperium: Secondary | ICD-10-CM | POA: Diagnosis not present

## 2017-06-14 DIAGNOSIS — Z3A39 39 weeks gestation of pregnancy: Secondary | ICD-10-CM

## 2017-06-14 DIAGNOSIS — O321XX0 Maternal care for breech presentation, not applicable or unspecified: Secondary | ICD-10-CM | POA: Diagnosis not present

## 2017-06-14 DIAGNOSIS — O26899 Other specified pregnancy related conditions, unspecified trimester: Secondary | ICD-10-CM

## 2017-06-14 DIAGNOSIS — Z36 Encounter for antenatal screening for chromosomal anomalies: Secondary | ICD-10-CM

## 2017-06-14 DIAGNOSIS — O3413 Maternal care for benign tumor of corpus uteri, third trimester: Secondary | ICD-10-CM | POA: Diagnosis not present

## 2017-06-14 DIAGNOSIS — M62830 Muscle spasm of back: Secondary | ICD-10-CM | POA: Diagnosis present

## 2017-06-14 DIAGNOSIS — J45909 Unspecified asthma, uncomplicated: Secondary | ICD-10-CM | POA: Diagnosis present

## 2017-06-14 DIAGNOSIS — Z98891 History of uterine scar from previous surgery: Secondary | ICD-10-CM

## 2017-06-14 DIAGNOSIS — O99824 Streptococcus B carrier state complicating childbirth: Secondary | ICD-10-CM | POA: Diagnosis present

## 2017-06-14 DIAGNOSIS — O26893 Other specified pregnancy related conditions, third trimester: Secondary | ICD-10-CM | POA: Diagnosis present

## 2017-06-14 DIAGNOSIS — O24410 Gestational diabetes mellitus in pregnancy, diet controlled: Secondary | ICD-10-CM | POA: Diagnosis present

## 2017-06-14 DIAGNOSIS — D219 Benign neoplasm of connective and other soft tissue, unspecified: Secondary | ICD-10-CM | POA: Diagnosis present

## 2017-06-14 DIAGNOSIS — O24429 Gestational diabetes mellitus in childbirth, unspecified control: Secondary | ICD-10-CM | POA: Diagnosis not present

## 2017-06-14 HISTORY — DX: Maternal care for breech presentation, not applicable or unspecified: O32.1XX0

## 2017-06-14 HISTORY — DX: Gestational diabetes mellitus in pregnancy, diet controlled: O24.410

## 2017-06-14 LAB — GLUCOSE, CAPILLARY
GLUCOSE-CAPILLARY: 91 mg/dL (ref 65–99)
Glucose-Capillary: 93 mg/dL (ref 65–99)

## 2017-06-14 SURGERY — Surgical Case
Anesthesia: Spinal

## 2017-06-14 MED ORDER — KETOROLAC TROMETHAMINE 30 MG/ML IJ SOLN
30.0000 mg | Freq: Four times a day (QID) | INTRAMUSCULAR | Status: DC | PRN
  Administered 2017-06-14: 30 mg via INTRAMUSCULAR

## 2017-06-14 MED ORDER — FENTANYL CITRATE (PF) 100 MCG/2ML IJ SOLN
INTRAMUSCULAR | Status: AC
  Filled 2017-06-14: qty 2

## 2017-06-14 MED ORDER — SIMETHICONE 80 MG PO CHEW
80.0000 mg | CHEWABLE_TABLET | Freq: Three times a day (TID) | ORAL | Status: DC
  Administered 2017-06-14 – 2017-06-17 (×8): 80 mg via ORAL
  Filled 2017-06-14 (×9): qty 1

## 2017-06-14 MED ORDER — NALOXONE HCL 0.4 MG/ML IJ SOLN: 0.4000 mg | INTRAMUSCULAR | Status: DC | PRN

## 2017-06-14 MED ORDER — ACETAMINOPHEN 325 MG PO TABS: 650.0000 mg | ORAL_TABLET | ORAL | Status: DC | PRN

## 2017-06-14 MED ORDER — ACETAMINOPHEN 500 MG PO TABS
1000.0000 mg | ORAL_TABLET | Freq: Four times a day (QID) | ORAL | Status: AC
  Administered 2017-06-14 – 2017-06-15 (×3): 1000 mg via ORAL
  Filled 2017-06-14 (×4): qty 2

## 2017-06-14 MED ORDER — DIPHENHYDRAMINE HCL 25 MG PO CAPS: 25.0000 mg | ORAL_CAPSULE | ORAL | Status: DC | PRN

## 2017-06-14 MED ORDER — SODIUM CHLORIDE 0.9 % IR SOLN
Status: DC | PRN
  Administered 2017-06-14: 1000 mL

## 2017-06-14 MED ORDER — IBUPROFEN 600 MG PO TABS
600.0000 mg | ORAL_TABLET | Freq: Four times a day (QID) | ORAL | Status: DC
  Administered 2017-06-14 – 2017-06-17 (×12): 600 mg via ORAL
  Filled 2017-06-14 (×12): qty 1

## 2017-06-14 MED ORDER — LORATADINE 10 MG PO TABS
10.0000 mg | ORAL_TABLET | Freq: Every day | ORAL | Status: DC
  Administered 2017-06-15 – 2017-06-17 (×3): 10 mg via ORAL
  Filled 2017-06-14 (×3): qty 1

## 2017-06-14 MED ORDER — FENTANYL CITRATE (PF) 100 MCG/2ML IJ SOLN
INTRAMUSCULAR | Status: DC | PRN
  Administered 2017-06-14: 20 ug via INTRATHECAL

## 2017-06-14 MED ORDER — PHENYLEPHRINE 8 MG IN D5W 100 ML (0.08MG/ML) PREMIX OPTIME
INJECTION | INTRAVENOUS | Status: AC
  Filled 2017-06-14: qty 100

## 2017-06-14 MED ORDER — MORPHINE SULFATE (PF) 0.5 MG/ML IJ SOLN
INTRAMUSCULAR | Status: AC
  Filled 2017-06-14: qty 10

## 2017-06-14 MED ORDER — KETOROLAC TROMETHAMINE 30 MG/ML IJ SOLN: 30.0000 mg | Freq: Four times a day (QID) | INTRAMUSCULAR | Status: DC | PRN

## 2017-06-14 MED ORDER — MORPHINE SULFATE-NACL 0.5-0.9 MG/ML-% IV SOSY
PREFILLED_SYRINGE | INTRAVENOUS | Status: DC | PRN
  Administered 2017-06-14: .2 mg via INTRATHECAL

## 2017-06-14 MED ORDER — FENTANYL CITRATE (PF) 100 MCG/2ML IJ SOLN: 25.0000 ug | INTRAMUSCULAR | Status: DC | PRN

## 2017-06-14 MED ORDER — ONDANSETRON HCL 4 MG/2ML IJ SOLN
INTRAMUSCULAR | Status: AC
  Filled 2017-06-14: qty 2

## 2017-06-14 MED ORDER — LACTATED RINGERS IV SOLN
INTRAVENOUS | Status: DC
  Administered 2017-06-14 – 2017-06-15 (×2): via INTRAVENOUS

## 2017-06-14 MED ORDER — ALBUTEROL SULFATE (2.5 MG/3ML) 0.083% IN NEBU: 2.5000 mg | INHALATION_SOLUTION | Freq: Four times a day (QID) | RESPIRATORY_TRACT | Status: DC | PRN

## 2017-06-14 MED ORDER — IBUPROFEN 600 MG PO TABS: 600.0000 mg | ORAL_TABLET | Freq: Four times a day (QID) | ORAL | Status: DC | PRN

## 2017-06-14 MED ORDER — MENTHOL 3 MG MT LOZG: 1.0000 | LOZENGE | OROMUCOSAL | Status: DC | PRN

## 2017-06-14 MED ORDER — PROMETHAZINE HCL 25 MG/ML IJ SOLN
6.2500 mg | INTRAMUSCULAR | Status: DC | PRN
  Administered 2017-06-14: 6.25 mg via INTRAVENOUS

## 2017-06-14 MED ORDER — OXYCODONE-ACETAMINOPHEN 5-325 MG PO TABS
2.0000 | ORAL_TABLET | ORAL | Status: DC | PRN
  Administered 2017-06-16 – 2017-06-17 (×3): 2 via ORAL
  Filled 2017-06-14 (×3): qty 2

## 2017-06-14 MED ORDER — ONDANSETRON HCL 4 MG/2ML IJ SOLN
INTRAMUSCULAR | Status: DC | PRN
  Administered 2017-06-14: 4 mg via INTRAVENOUS

## 2017-06-14 MED ORDER — KETOROLAC TROMETHAMINE 30 MG/ML IJ SOLN
INTRAMUSCULAR | Status: AC
  Filled 2017-06-14: qty 1

## 2017-06-14 MED ORDER — CEFAZOLIN SODIUM-DEXTROSE 2-4 GM/100ML-% IV SOLN
2.0000 g | INTRAVENOUS | Status: AC
  Administered 2017-06-14: 2 g via INTRAVENOUS
  Filled 2017-06-14: qty 100

## 2017-06-14 MED ORDER — MEPERIDINE HCL 25 MG/ML IJ SOLN: 6.2500 mg | INTRAMUSCULAR | Status: DC | PRN

## 2017-06-14 MED ORDER — ONDANSETRON HCL 4 MG/2ML IJ SOLN: 4.0000 mg | Freq: Three times a day (TID) | INTRAMUSCULAR | Status: DC | PRN

## 2017-06-14 MED ORDER — SCOPOLAMINE 1 MG/3DAYS TD PT72
1.0000 | MEDICATED_PATCH | Freq: Once | TRANSDERMAL | Status: DC
  Filled 2017-06-14: qty 1

## 2017-06-14 MED ORDER — BUPIVACAINE IN DEXTROSE 0.75-8.25 % IT SOLN
INTRATHECAL | Status: AC
  Filled 2017-06-14: qty 2

## 2017-06-14 MED ORDER — SCOPOLAMINE 1 MG/3DAYS TD PT72
MEDICATED_PATCH | TRANSDERMAL | Status: AC
  Filled 2017-06-14: qty 1

## 2017-06-14 MED ORDER — ZOLPIDEM TARTRATE 5 MG PO TABS: 5.0000 mg | ORAL_TABLET | Freq: Every evening | ORAL | Status: DC | PRN

## 2017-06-14 MED ORDER — PHENYLEPHRINE 8 MG IN D5W 100 ML (0.08MG/ML) PREMIX OPTIME
INJECTION | INTRAVENOUS | Status: DC | PRN
  Administered 2017-06-14: 60 ug/min via INTRAVENOUS

## 2017-06-14 MED ORDER — OXYTOCIN 40 UNITS IN LACTATED RINGERS INFUSION - SIMPLE MED: 2.5000 [IU]/h | INTRAVENOUS | Status: AC

## 2017-06-14 MED ORDER — DIPHENHYDRAMINE HCL 50 MG/ML IJ SOLN: 12.5000 mg | INTRAMUSCULAR | Status: DC | PRN

## 2017-06-14 MED ORDER — SIMETHICONE 80 MG PO CHEW
80.0000 mg | CHEWABLE_TABLET | ORAL | Status: DC | PRN
Start: 2017-06-14 — End: 2017-06-17

## 2017-06-14 MED ORDER — DIPHENHYDRAMINE HCL 25 MG PO CAPS: 25.0000 mg | ORAL_CAPSULE | Freq: Four times a day (QID) | ORAL | Status: DC | PRN

## 2017-06-14 MED ORDER — OXYTOCIN 10 UNIT/ML IJ SOLN
INTRAMUSCULAR | Status: DC | PRN
  Administered 2017-06-14: 40 [IU] via INTRAVENOUS

## 2017-06-14 MED ORDER — PRENATAL MULTIVITAMIN CH
1.0000 | ORAL_TABLET | Freq: Every day | ORAL | Status: DC
  Administered 2017-06-15 – 2017-06-17 (×3): 1 via ORAL
  Filled 2017-06-14 (×3): qty 1

## 2017-06-14 MED ORDER — OXYCODONE-ACETAMINOPHEN 5-325 MG PO TABS
1.0000 | ORAL_TABLET | ORAL | Status: DC | PRN
  Administered 2017-06-15 (×3): 1 via ORAL
  Filled 2017-06-14 (×3): qty 1

## 2017-06-14 MED ORDER — FERROUS SULFATE 325 (65 FE) MG PO TABS
325.0000 mg | ORAL_TABLET | Freq: Every day | ORAL | Status: DC
  Administered 2017-06-15: 325 mg via ORAL
  Filled 2017-06-14: qty 1

## 2017-06-14 MED ORDER — LACTATED RINGERS IV SOLN
INTRAVENOUS | Status: DC
  Administered 2017-06-14 (×3): via INTRAVENOUS

## 2017-06-14 MED ORDER — TETANUS-DIPHTH-ACELL PERTUSSIS 5-2.5-18.5 LF-MCG/0.5 IM SUSP: 0.5000 mL | Freq: Once | INTRAMUSCULAR | Status: DC

## 2017-06-14 MED ORDER — SCOPOLAMINE 1 MG/3DAYS TD PT72
MEDICATED_PATCH | TRANSDERMAL | Status: DC | PRN
  Administered 2017-06-14: 1 via TRANSDERMAL

## 2017-06-14 MED ORDER — PROMETHAZINE HCL 25 MG/ML IJ SOLN
INTRAMUSCULAR | Status: AC
  Filled 2017-06-14: qty 1

## 2017-06-14 MED ORDER — OXYTOCIN 10 UNIT/ML IJ SOLN
INTRAMUSCULAR | Status: AC
Start: 2017-06-14 — End: 2017-06-14
  Filled 2017-06-14: qty 4

## 2017-06-14 MED ORDER — KETOROLAC TROMETHAMINE 30 MG/ML IJ SOLN: 30.0000 mg | Freq: Once | INTRAMUSCULAR | Status: DC | PRN

## 2017-06-14 MED ORDER — COCONUT OIL OIL
1.0000 "application " | TOPICAL_OIL | Status: DC | PRN
  Administered 2017-06-15: 1 via TOPICAL
  Filled 2017-06-14: qty 120

## 2017-06-14 MED ORDER — SIMETHICONE 80 MG PO CHEW
80.0000 mg | CHEWABLE_TABLET | ORAL | Status: DC
  Administered 2017-06-15 – 2017-06-17 (×3): 80 mg via ORAL
  Filled 2017-06-14 (×3): qty 1

## 2017-06-14 MED ORDER — SODIUM CHLORIDE 0.9% FLUSH: 3.0000 mL | INTRAVENOUS | Status: DC | PRN

## 2017-06-14 MED ORDER — NALOXONE HCL 2 MG/2ML IJ SOSY
1.0000 ug/kg/h | PREFILLED_SYRINGE | INTRAVENOUS | Status: DC | PRN
  Filled 2017-06-14: qty 2

## 2017-06-14 MED ORDER — SENNOSIDES-DOCUSATE SODIUM 8.6-50 MG PO TABS
2.0000 | ORAL_TABLET | ORAL | Status: DC
  Administered 2017-06-15 – 2017-06-17 (×3): 2 via ORAL
  Filled 2017-06-14 (×3): qty 2

## 2017-06-14 MED ORDER — BUPIVACAINE IN DEXTROSE 0.75-8.25 % IT SOLN
INTRATHECAL | Status: DC | PRN
  Administered 2017-06-14: 9.75 mg via INTRATHECAL

## 2017-06-14 SURGICAL SUPPLY — 33 items
BENZOIN TINCTURE PRP APPL 2/3 (GAUZE/BANDAGES/DRESSINGS) ×2 IMPLANT
CHLORAPREP W/TINT 26ML (MISCELLANEOUS) ×2 IMPLANT
CLAMP CORD UMBIL (MISCELLANEOUS) ×2 IMPLANT
CLOTH BEACON ORANGE TIMEOUT ST (SAFETY) ×2 IMPLANT
CONTAINER PREFILL 10% NBF 15ML (MISCELLANEOUS) IMPLANT
DRSG OPSITE POSTOP 4X10 (GAUZE/BANDAGES/DRESSINGS) ×2 IMPLANT
ELECT REM PT RETURN 9FT ADLT (ELECTROSURGICAL) ×2
ELECTRODE REM PT RTRN 9FT ADLT (ELECTROSURGICAL) ×1 IMPLANT
EXTRACTOR VACUUM KIWI (MISCELLANEOUS) IMPLANT
EXTRACTOR VACUUM M CUP 4 TUBE (SUCTIONS) IMPLANT
GLOVE BIO SURGEON STRL SZ7 (GLOVE) ×4 IMPLANT
GLOVE BIOGEL PI IND STRL 7.0 (GLOVE) ×3 IMPLANT
GLOVE BIOGEL PI INDICATOR 7.0 (GLOVE) ×3
GOWN STRL REUS W/TWL LRG LVL3 (GOWN DISPOSABLE) ×6 IMPLANT
KIT ABG SYR 3ML LUER SLIP (SYRINGE) IMPLANT
NEEDLE HYPO 25X5/8 SAFETYGLIDE (NEEDLE) IMPLANT
NS IRRIG 1000ML POUR BTL (IV SOLUTION) ×2 IMPLANT
PACK C SECTION WH (CUSTOM PROCEDURE TRAY) ×2 IMPLANT
PAD OB MATERNITY 4.3X12.25 (PERSONAL CARE ITEMS) ×2 IMPLANT
RTRCTR C-SECT PINK 25CM LRG (MISCELLANEOUS) ×2 IMPLANT
STRIP CLOSURE SKIN 1/2X4 (GAUZE/BANDAGES/DRESSINGS) ×2 IMPLANT
SUT MON AB-0 CT1 36 (SUTURE) ×4 IMPLANT
SUT PLAIN 0 NONE (SUTURE) IMPLANT
SUT PLAIN 2 0 (SUTURE)
SUT PLAIN ABS 2-0 CT1 27XMFL (SUTURE) IMPLANT
SUT VIC AB 0 CT1 27 (SUTURE) ×2
SUT VIC AB 0 CT1 27XBRD ANBCTR (SUTURE) ×2 IMPLANT
SUT VIC AB 2-0 CT1 27 (SUTURE) ×1
SUT VIC AB 2-0 CT1 TAPERPNT 27 (SUTURE) ×1 IMPLANT
SUT VIC AB 4-0 KS 27 (SUTURE) ×2 IMPLANT
SUT VICRYL 0 TIES 12 18 (SUTURE) IMPLANT
TOWEL OR 17X24 6PK STRL BLUE (TOWEL DISPOSABLE) ×2 IMPLANT
TRAY FOLEY BAG SILVER LF 14FR (SET/KITS/TRAYS/PACK) ×2 IMPLANT

## 2017-06-14 NOTE — Anesthesia Postprocedure Evaluation (Signed)
Anesthesia Post Note  Patient: Ana Davis  Procedure(s) Performed: Procedure(s) (LRB): Primary CESAREAN SECTION (N/A)     Patient location during evaluation: Mother Baby Anesthesia Type: Epidural Level of consciousness: awake Pain management: satisfactory to patient Vital Signs Assessment: post-procedure vital signs reviewed and stable Respiratory status: spontaneous breathing Cardiovascular status: stable Anesthetic complications: no    Last Vitals:  Vitals:   06/14/17 1122 06/14/17 1335  BP: (!) 103/59 (!) 95/55  Pulse: (!) 57 (!) 59  Resp: 16 18  Temp: 36.4 C     Last Pain:  Vitals:   06/14/17 1335  TempSrc: Oral  PainSc: 2    Pain Goal: Patients Stated Pain Goal: 4 (06/14/17 1594)               Casimer Lanius

## 2017-06-14 NOTE — Anesthesia Postprocedure Evaluation (Signed)
Anesthesia Post Note  Patient: Ana Davis  Procedure(s) Performed: Procedure(s) (LRB): Primary CESAREAN SECTION (N/A)     Patient location during evaluation: Mother Baby Anesthesia Type: Spinal Level of consciousness: awake Pain management: satisfactory to patient Vital Signs Assessment: post-procedure vital signs reviewed and stable Respiratory status: spontaneous breathing Cardiovascular status: stable Anesthetic complications: no    Last Vitals:  Vitals:   06/14/17 1122 06/14/17 1335  BP: (!) 103/59 (!) 95/55  Pulse: (!) 57 (!) 59  Resp: 16 18  Temp: 36.4 C     Last Pain:  Vitals:   06/14/17 1335  TempSrc: Oral  PainSc: 2    Pain Goal: Patients Stated Pain Goal: 4 (06/14/17 5301)               Casimer Lanius

## 2017-06-14 NOTE — Anesthesia Procedure Notes (Addendum)
Spinal  Patient location during procedure: OR Start time: 06/14/2017 7:23 AM End time: 06/14/2017 7:27 AM Staffing Anesthesiologist: Lyn Hollingshead Performed: anesthesiologist  Preanesthetic Checklist Completed: patient identified, surgical consent, pre-op evaluation, timeout performed, IV checked, risks and benefits discussed and monitors and equipment checked Spinal Block Patient position: sitting Prep: site prepped and draped and DuraPrep Patient monitoring: heart rate, cardiac monitor, continuous pulse ox and blood pressure Approach: midline Location: L3-4 Injection technique: single-shot Needle Needle type: Pencan  Needle gauge: 24 G Needle length: 10 cm Needle insertion depth: 5 cm Assessment Sensory level: T4

## 2017-06-14 NOTE — Anesthesia Preprocedure Evaluation (Signed)
Anesthesia Evaluation  Patient identified by MRN, date of birth, ID band Patient awake    Reviewed: Allergy & Precautions, H&P , NPO status , Patient's Chart, lab work & pertinent test results  Airway Mallampati: I  TM Distance: >3 FB Neck ROM: full    Dental no notable dental hx. (+) Teeth Intact   Pulmonary    Pulmonary exam normal breath sounds clear to auscultation       Cardiovascular negative cardio ROS Normal cardiovascular exam     Neuro/Psych negative neurological ROS     GI/Hepatic negative GI ROS, Neg liver ROS,   Endo/Other  negative endocrine ROSdiabetes  Renal/GU negative Renal ROS     Musculoskeletal negative musculoskeletal ROS (+)   Abdominal Normal abdominal exam  (+)   Peds  Hematology negative hematology ROS (+)   Anesthesia Other Findings   Reproductive/Obstetrics (+) Pregnancy                             Anesthesia Physical Anesthesia Plan  ASA: II  Anesthesia Plan:    Post-op Pain Management:    Induction:   PONV Risk Score and Plan: 3 and Ondansetron, Dexamethasone, Propofol and Midazolam  Airway Management Planned:   Additional Equipment:   Intra-op Plan:   Post-operative Plan:   Informed Consent: I have reviewed the patients History and Physical, chart, labs and discussed the procedure including the risks, benefits and alternatives for the proposed anesthesia with the patient or authorized representative who has indicated his/her understanding and acceptance.     Plan Discussed with: CRNA and Surgeon  Anesthesia Plan Comments:         Anesthesia Quick Evaluation

## 2017-06-14 NOTE — Progress Notes (Signed)
Interval note:  Notified by RN of patient's allergy to Hydrocodone - reports hallucinations with Oxycodone IR.  Pt. Requests Percocet because she has never had a reaction in the past.  Orders placed and RN notified.  Lars Pinks, [facility]

## 2017-06-14 NOTE — Lactation Note (Signed)
This note was copied from a baby's chart. Lactation Consultation Note  Patient Name: Ana Davis [id number] Date: 06/14/2017 Reason for consult: Initial assessment  Initial visit at 44 hours of age.  Mom reports a good recent feed of about 50min.  FOB holding baby asleep.  Lc assisted mom with hand expression of a few drops and applied to baby's mouth.  Baby awakened and showing feeding cues.  LC assisted with football hold on left breast.  Mom denies pain.  Baby has wide gape and flanged lips.  Baby needs stimulation to maintain feeding.  LC encouraged mom to offer EBM by spoon with each feeding. Mom has hand pump at bedside, LC instructed mom on use of hand pump.  LC discussed with mom if baby does not do 15-20 minutes regularly she may need to use DEBP to increase stimulation to establish a good milk supply.  At this time mom will continue to work on hand expression with each feeding.   [facility] LC resources given and discussed.  Encouraged to feed with early cues on demand.  Early newborn behavior discussed.  Hand expression demonstrated by mom with colostrum visible.  Mom to call for assist as needed.     Maternal Data Has patient been taught Hand Expression?: Yes Does the patient have breastfeeding experience prior to this delivery?: No  Feeding Feeding Type: Breast Fed Length of feed: 1 min  LATCH Score/Interventions Latch: Grasps breast easily, tongue down, lips flanged, rhythmical sucking. Intervention(s): Adjust position;Assist with latch;Breast massage;Breast compression  Audible Swallowing: A few with stimulation  Type of Nipple: Everted at rest and after stimulation  Comfort (Breast/Nipple): Soft / non-tender     Hold (Positioning): Assistance needed to correctly position infant at breast and maintain latch. Intervention(s): Breastfeeding basics reviewed;Support Pillows;Position options;Skin to skin  LATCH Score: 8  Lactation Tools Discussed/Used     Consult  Status Consult Status: Follow-up Date: 06/15/17 Follow-up type: In-patient    Justice Britain 06/14/2017, 9:28 PM

## 2017-06-14 NOTE — Addendum Note (Signed)
Addendum  created 06/14/17 1441 by Asher Muir, CRNA   Delete clinical note, Sign clinical note

## 2017-06-14 NOTE — Anesthesia Postprocedure Evaluation (Signed)
Anesthesia Post Note  Patient: Ana Davis  Procedure(s) Performed: Procedure(s) (LRB): Primary CESAREAN SECTION (N/A)     Patient location during evaluation: PACU Anesthesia Type: Spinal Level of consciousness: awake Pain management: pain level controlled Vital Signs Assessment: post-procedure vital signs reviewed and stable Respiratory status: spontaneous breathing Cardiovascular status: stable Postop Assessment: no headache, no backache, spinal receding, patient able to bend at knees and no signs of nausea or vomiting Anesthetic complications: no    Last Vitals:  Vitals:   06/14/17 1000 06/14/17 1015  BP: 102/62 (!) 94/57  Pulse: 67 64  Resp: 13 16  Temp: 36.4 C     Last Pain:  Vitals:   06/14/17 1000  TempSrc: Oral   Pain Goal: Patients Stated Pain Goal: 4 (06/14/17 0655)               Crosley Stejskal JR,JOHN Mateo [facility]

## 2017-06-14 NOTE — Transfer of Care (Deleted)
Immediate Anesthesia Transfer of Care Note  Patient: Ana Davis  Procedure(s) Performed: Procedure(s) with comments: Primary CESAREAN SECTION (N/A) - EDD: 06/20/17 Allergy: Codeine  Patient Location: PACU  Anesthesia Type:Spinal  Level of Consciousness: awake  Airway & Oxygen Therapy: Patient Spontanous Breathing  Post-op Assessment: Report given to RN  Post vital signs: Reviewed and stable  Last Vitals:  Vitals:   06/14/17 0555  BP: 127/75  Pulse: 80  Resp: 20  Temp: 36.8 C    Last Pain:  Vitals:   06/14/17 0555  TempSrc: Oral      Patients Stated Pain Goal: 4 (43/88/87 5797)  Complications: No apparent anesthesia complications

## 2017-06-14 NOTE — Op Note (Signed)
Cesarean Section Procedure Note  Ana Davis  06/14/2017  Procedure: Primary Low Transverse Cesarean section                     (two layer closure)   Indications: Breech Presentation   Pre-operative Diagnosis: Breech Presentation 39 weeks                                              Fibroids affecting pregnancy                                              A1GDM  Post-operative Diagnosis: Same   Surgeon: Azucena Fallen, MD    Assistants: Lars Pinks, CNM  Anesthesia: Spinal   Procedure Details:  The patient was seen in the Holding Room. The risks, benefits, complications, treatment options, and expected outcomes were discussed with the patient. The patient concurred with the proposed plan, giving informed consent. identified as Ana Davis and the procedure verified as C-Section Delivery. A Time Out was held and the above information confirmed.  2 gm Ancef given.  After induction of anesthesia, the patient was draped and prepped in the usual sterile manner and foley was placed.  A pfannenstiel incision:was made and carried down through the subcutaneous tissue to the fascia. Fascial incision was made and extended transversely. The fascia was separated from the underlying rectus tissue superiorly and inferiorly. The peritoneum was identified and entered. Peritoneal incision was extended longitudinally. The utero-vesical peritoneal reflection was incised transversely and the bladder flap was bluntly freed from the lower uterine segment. A low transverse uterine incision was made. Clear amniotic fluid drained  Delivered from complete breech  presentation was a Female infant at 7.52 am by complete breech extraction in smooth fashion, delayed cord clamping done. Baby handed to NICU team. Apgar scores of 8 at one minute and 9 at five minutes. Umbilical cord blood was obtained for evaluation. The placenta was removed Intact and appeared normal. The uterine outline, tubes and ovaries  appeared normal.  The uterine incision was closed with running locked sutures of 0 Monocryl followed by a second imbricating layer.  Hemostasis was observed. Alexis retractor was removed. Peritoneal closure done with 2-0 Vicryl. The fascia was then reapproximated with running sutures of 0Vicryl. The subcuticular closure was performed using 2-0plain gut. The skin was closed with 4-0Vicryl.   Instrument, sponge, and needle counts were correct prior the abdominal closure and were correct at the conclusion of the case.    Findings:  BOY, complete breech, delivered at 7.52 am weight 5'13" . Apgard 8, 9.    Estimated Blood Loss:600 cc  Total IV Fluids: 2200 ml LR   Urine Output: 200CC OF clear urine  Specimens: cord blood and placenta   Complications: no complications  Disposition: PACU - hemodynamically stable.   Maternal Condition: stable   Baby condition / location:  Couplet care / Skin to Skin  Attending Attestation: I performed the procedure.   Signed: Surgeon(s): Azucena Fallen, MD

## 2017-06-15 LAB — TYPE AND SCREEN
ABO/RH(D): O NEG
ANTIBODY SCREEN: POSITIVE
DAT, IgG: NEGATIVE
Unit division: 0
Unit division: 0

## 2017-06-15 LAB — CBC
HCT: 22.4 % — ABNORMAL LOW (ref 36.0–46.0)
HEMOGLOBIN: 7.8 g/dL — AB (ref 12.0–15.0)
MCH: 33.3 pg (ref 26.0–34.0)
MCHC: 34.8 g/dL (ref 30.0–36.0)
MCV: 95.7 fL (ref 78.0–100.0)
Platelets: 155 10*3/uL (ref 150–400)
RBC: 2.34 MIL/uL — AB (ref 3.87–5.11)
RDW: 14.1 % (ref 11.5–15.5)
WBC: 11.5 10*3/uL — ABNORMAL HIGH (ref 4.0–10.5)

## 2017-06-15 LAB — BPAM RBC
BLOOD PRODUCT EXPIRATION DATE: 201807012359
Blood Product Expiration Date: 201807102359
UNIT TYPE AND RH: 9500
UNIT TYPE AND RH: 9500

## 2017-06-15 MED ORDER — SODIUM CHLORIDE 0.9 % IV SOLN
200.0000 mg | Freq: Once | INTRAVENOUS | Status: AC
  Administered 2017-06-15: 200 mg via INTRAVENOUS
  Filled 2017-06-15: qty 10

## 2017-06-15 MED ORDER — CYCLOBENZAPRINE HCL 10 MG PO TABS
10.0000 mg | ORAL_TABLET | Freq: Once | ORAL | Status: AC
  Administered 2017-06-15: 10 mg via ORAL
  Filled 2017-06-15: qty 1

## 2017-06-15 MED ORDER — FERROUS SULFATE 325 (65 FE) MG PO TABS
325.0000 mg | ORAL_TABLET | Freq: Three times a day (TID) | ORAL | Status: DC
  Administered 2017-06-15 (×2): 325 mg via ORAL
  Filled 2017-06-15 (×3): qty 1

## 2017-06-15 MED ORDER — DIBUCAINE 1 % RE OINT: 1.0000 "application " | TOPICAL_OINTMENT | RECTAL | Status: DC | PRN

## 2017-06-15 MED ORDER — MAGNESIUM OXIDE 400 (241.3 MG) MG PO TABS
400.0000 mg | ORAL_TABLET | Freq: Every day | ORAL | Status: DC
  Administered 2017-06-15 – 2017-06-17 (×3): 400 mg via ORAL
  Filled 2017-06-15 (×4): qty 1

## 2017-06-15 MED ORDER — WITCH HAZEL-GLYCERIN EX PADS: 1.0000 "application " | MEDICATED_PAD | CUTANEOUS | Status: DC | PRN

## 2017-06-15 MED ORDER — RHO D IMMUNE GLOBULIN 1500 UNIT/2ML IJ SOSY
300.0000 ug | PREFILLED_SYRINGE | Freq: Once | INTRAMUSCULAR | Status: AC
  Administered 2017-06-15: 300 ug via INTRAVENOUS
  Filled 2017-06-15: qty 2

## 2017-06-15 NOTE — Lactation Note (Addendum)
This note was copied from a baby's chart. Lactation Consultation Note  Patient Name: Ana Davis [id number] Date: 06/15/2017 Reason for consult: Initial assessment;Infant < 6lbs   Initial assessment with first time mom of 61 hour old infant. Infant with 4 BF for 15-40 minutes, 2 attempts, 4 voids and 4 stools. LATCH scores 6-8. Infant weight 5 lb 9.4 oz with 4% weight loss since birth. Maternal history of GDM-Diet controlled.   Infant was latched and feeding to left breast when LC entered room. Mom reports he had been feeding for 40 minutes. Infant with a few swallows at this time. Mom noted that latch was uncomfortable. Infant was noted to have shallow latch. He was removed from breast and nipple was noted to be compressed. The tip of the nipple was reddened. Nipple tissue intact. Attempted to relatch infant to right breast and infant went to sleep. Advised mom to call with next feeding if especially if nipples are still pinched to evaluate for NS use.   Mom with compressible breasts and areola with everted nipples. Was not able to hand express colostrum from either breast.  Infant with high palate and short palpable anterior lingual frenulum. Was not discussed with parents.   DEBP set up with instructions for set up, assembling, disassembling, and cleaning of pump parts. Enc mom to pump every 3 hours post BF to stimulate milk production. Attempted to hand express mom, no colostrum visible. Parents to call if EBM obtained to be shown how to spoon or syringe feed.   Discussed that if infant is too sleepy at breast or increased weight loss that infant may need to get formula if EBM not available. Mom was tearful at this point.   Feeding plan written on board: 1. Breast feed infant 8-12 x in 24 hours at first feeding cues with no longer than 3 hours between feeds. 2. Supplement infant with EBM if available.  3. Pump for 15 minutes with DEBP on Initiate setting 4. Hand express post  pumping 5. Call for assistance as needed     Maternal Data Formula Feeding for Exclusion: No Has patient been taught Hand Expression?: Yes Does the patient have breastfeeding experience prior to this delivery?: No  Feeding Feeding Type: Breast Fed Length of feed: 10 min  LATCH Score/Interventions Latch: Repeated attempts needed to sustain latch, nipple held in mouth throughout feeding, stimulation needed to elicit sucking reflex. Intervention(s): Adjust position;Assist with latch;Breast massage;Breast compression  Audible Swallowing: A few with stimulation Intervention(s): Skin to skin;Hand expression;Alternate breast massage  Type of Nipple: Everted at rest and after stimulation  Comfort (Breast/Nipple): Filling, red/small blisters or bruises, mild/mod discomfort  Problem noted: Mild/Moderate discomfort  Hold (Positioning): Assistance needed to correctly position infant at breast and maintain latch. Intervention(s): Breastfeeding basics reviewed;Support Pillows;Position options;Skin to skin  LATCH Score: 6  Lactation Tools Discussed/Used Tools: Pump Breast pump type: Double-Electric Breast Pump WIC Program: No Pump Review: Setup, frequency, and cleaning;Milk Storage Initiated by:: Nonah Mattes, RN, IBCLC Date initiated:: 06/15/17   Consult Status Consult Status: Follow-up Date: 06/16/17 Follow-up type: In-patient    Debby Freiberg Omelia Marquart 06/15/2017, 1:22 PM

## 2017-06-15 NOTE — Progress Notes (Addendum)
POSTOPERATIVE DAY # 1 S/P Primary LTCS for Breech, large uterine fibroids, A1GDM   S:         Reports feeling good, with minimal pain              Tolerating po intake / no nausea / no vomiting / + flatus / no BM  Denies dizziness, SOB, or CP             Bleeding is light             Pain controlled with Motrin and Percocet             Up ad lib / ambulatory/ voiding QS  Newborn breast feeding - has been working with lactation and feels like he has latched well / Circumcision - planning    O:  VS: BP (!) 108/49 (BP Location: Right Arm)   Pulse 61   Temp 97.9 F (36.6 C) (Oral)   Resp 18   Ht 4\' 11"  (1.499 m)   Wt 67.1 kg (148 lb)   LMP 09/12/2016   SpO2 99%   Breastfeeding? Unknown   BMI 29.89 kg/m    LABS:               Recent Labs  06/15/17 0522  WBC 11.5*  HGB 7.8*  PLT 155               Bloodtype: --/--/O NEG (06/15 1120)  Rubella: 2.70 (10/17 1417)                                             I&O: Intake/Output      06/18 0701 - 06/19 0700 06/19 0701 - 06/20 0700   I.V. (mL/kg) 2547 (38)    Total Intake(mL/kg) 2547 (38)    Urine (mL/kg/hr) 1775 (1.1)    Emesis/NG output 50 (0)    Blood 600 (0.4)    Total Output 2425     Net +122                       Physical Exam:             Alert and Oriented X3  Lungs: Clear and unlabored  Heart: regular rate and rhythm / + mumur  Abdomen: soft, non-tender, non-distended, active bowel sounds in all 4 quadrants             Fundus: firm, non-tender, U-1, fibroids palpated U+1             Dressing: pressure dressing removed by me, honeycomb dressing underneath 100% saturated with sanguineous drainage.  No active drainage now             Incision:  approximated with sutures / no erythema / no ecchymosis / no active drainage  Perineum: intact  Lochia: appropriate, no clots  Extremities: no edema, no calf pain or tenderness  A:        POD # 1 S/P Primary LTCS for Breech  Large uterine fibroids  A1GDM, delivered,  stable   RH Negative, Baby O Pos - s/p Rhogam today            ABL Anemia compounding Maternal IDA  P:        Routine postoperative care              Rhogam given by  RN when I was present for exam  Plan for Venofer IV 200mg  x 1 dose  Ferrous Sulfate 325mg  TID  Magnesium oxide 400mg  daily   Change honeycomb dressing today  May shower and ambulate today  Repeat CBC in AM   Continuing working with lactation  Continue current care Consult: Dr. Murrell Redden for IV Iron  Lars Pinks, MSN, CNM [facility]

## 2017-06-16 LAB — RH IG WORKUP (INCLUDES ABO/RH)
ABO/RH(D): O NEG
Fetal Screen: NEGATIVE
Gestational Age(Wks): 39.2
Unit division: 0

## 2017-06-16 LAB — CBC
HCT: 23.1 % — ABNORMAL LOW (ref 36.0–46.0)
Hemoglobin: 8 g/dL — ABNORMAL LOW (ref 12.0–15.0)
MCH: 33.2 pg (ref 26.0–34.0)
MCHC: 34.6 g/dL (ref 30.0–36.0)
MCV: 95.9 fL (ref 78.0–100.0)
PLATELETS: 172 10*3/uL (ref 150–400)
RBC: 2.41 MIL/uL — ABNORMAL LOW (ref 3.87–5.11)
RDW: 14.5 % (ref 11.5–15.5)
WBC: 12.8 10*3/uL — AB (ref 4.0–10.5)

## 2017-06-16 MED ORDER — POLYSACCHARIDE IRON COMPLEX 150 MG PO CAPS
150.0000 mg | ORAL_CAPSULE | Freq: Two times a day (BID) | ORAL | Status: DC
  Administered 2017-06-16 – 2017-06-17 (×3): 150 mg via ORAL
  Filled 2017-06-16 (×3): qty 1

## 2017-06-16 NOTE — Lactation Note (Signed)
This note was copied from a baby's chart. Lactation Consultation Note  Patient Name: Ana Davis Today's Date: 06/16/2017 Reason for consult: Follow-up assessment;Infant < 6lbs   Follow-up consult at 58 hrs old; Mom is a P1.  Infant was circumcised today.   Ga 39.2; BW 5 lbs, 13 oz.  7% weight loss. Infant has breastfed x10 (10-45 min) + attempt x2 (0min) + formula via bottle x3 (15-20 ml); voids-3 in 24 hrs; stools-5 in 24 hrs. Mom feeding on left breast football hold when LC entered room.  LC assisted with helping flange lips more; several swallows heard; LS-9. Mom reports feeding only on one side and then giving supplement.  Encouraged feeding on both sides before giving supplement since infant was transfering milk and mom reports breast feel heavier today.   Reviewed cluster feeding, encouraged feeding on second breast with each feeding before giving supplementation. Mom is only getting drops with pumping.   Encouraged using hands-on pumping with hand expression at end of pumping session to increase milk output.  Encouraged mom to begin using EBM as milk volume increases as supplement.   Mom has Medela DEBP at home she plans to use after discharge.  Reviewed engorgement prevention.  Also has HP in room and DEBP kit.  Encouraged mom to take all kit parts with her when discharged. Encouraged to call for assistance as needed with feedings.      Maternal Data    Feeding Feeding Type: Breast Fed Nipple Type: Slow - flow Length of feed: 15 min  LATCH Score/Interventions Latch: Grasps breast easily, tongue down, lips flanged, rhythmical sucking.  Audible Swallowing: A few with stimulation  Type of Nipple: Everted at rest and after stimulation  Comfort (Breast/Nipple): Soft / non-tender     Hold (Positioning): No assistance needed to correctly position infant at breast. Intervention(s): Breastfeeding basics reviewed;Skin to skin  LATCH Score: 9  Lactation Tools  Discussed/Used     Consult Status Consult Status: Follow-up Date: 06/17/17 Follow-up type: In-patient    Vann, Stephanie Walker 06/16/2017, 6:16 PM    

## 2017-06-16 NOTE — Progress Notes (Signed)
POSTOPERATIVE DAY # 2 S/P Primary LTCS for Breech, large uterine fibroids, A1GDM  Subjective:  Information for the patient's newborn:  Brett, Soza [[id number]]  female   circ pending Baby name: Izora Gala II  Reports feeling sore, back spasms during night, improved w/ Flexeril and heating pad. Feeding: breast and bottle Patient reports tolerating PO.  Breast symptoms: + colostrum Pain controlled with PO meds Denies HA/SOB/C/P/N/V/dizziness. Flatus present. She reports vaginal bleeding as normal, without clots.  She is ambulating in room, urinating without difficulty.     Objective:   VS:    Vitals:   06/15/17 0421 06/15/17 0809 06/15/17 1745 06/16/17 0627  BP: (!) 97/47 (!) 108/49 (!) 103/54 103/66  Pulse: 67 61 70 71  Resp: 16 18 18 18   Temp: 97.9 F (36.6 C) 97.9 F (36.6 C) 97.9 F (36.6 C) 98 F (36.7 C)  TempSrc: Oral Oral Oral Axillary  SpO2: 100% 99%    Weight:      Height:        No intake or output data in the 24 hours ending 06/16/17 0958      Recent Labs  06/15/17 0522 06/16/17 0544  WBC 11.5* 12.8*  HGB 7.8* 8.0*  HCT 22.4* 23.1*  PLT 155 172     Blood type: --/--/O NEG (06/19 0522)  Rubella: 2.70 (10/17 1417)     Physical Exam:  General: alert, cooperative and no distress CV: Regular rate and rhythm Resp: clear Abdomen: soft, nontender, hypotonic bowel sounds Incision: clean, dry and intact Uterine Fundus: firm, below umbilicus, nontender Lochia: minimal Ext: no edema, redness or tenderness in the calves or thighs      Assessment/Plan: 36 y.o.   POD# 2. G1P1001                  Principal Problem:   Postpartum care following cesarean delivery (6/18)    Large uterine fibroids             A1GDM, delivered, stable              RH Negative, Baby O Pos - s/p Rhogam              ABL Anemia compounding Maternal IDA   - s/p IV iron x 1 dose, continue oral Fe and Mag Ox  Back spasm - controlled w/ comfort measures and  Flexeril  Doing well, stable.               Advance diet as tolerated Encourage rest when baby rests Breastfeeding support Encourage to ambulate, warm fluids to increase gut motility Routine post-op care  Juliene Pina, CNM, MSN 06/16/2017, 9:58 AM

## 2017-06-16 NOTE — Lactation Note (Addendum)
This note was copied from a baby's chart. Lactation Consultation Note New mom concerned baby isn't getting anything from breast. Feels she may need to supplement after BF. Baby had 7% weight loss in 48 hrs old. Had 7 voids and 6 stools. Discussed output very good. Hand expression w/thick colostrum noted. Mm has used DEBP but discouraged d/t nothing came out. Discussed colostrum, supply and demand, hand expressed 0.5 ml thick colostrum. Encouraged to use DEBP every 3 hrs.  Mom has heavy breast, short shaft nipples. Shells given to wear in bra to evert nipples more. Noted soreness, bruising  Rolling in finger tips stimulating helps for deeper latch. Discussed posture, comfort, support, and props while BF. Nipples bruised, RN gave coconut oil. Encouraged to apply before pumping or feeding.  Baby weight 5. 6 lbs. Gave mom supplementing information sheet. Supplemented w/Alimentum, Gave 20 ml, And0.5 ml colostrum. Encouraged not to feed longer than 30 min. Total for BF and supplementing.  FOB supportive at bedside. Answered questions from mom and dad.  Reported to RN plan;  BF Supplement, w/colostrum/formula to equal amount needed Then post pump.  Patient Name: Boy Tyjae Shvartsman AESLP'N Date: 06/16/2017 Reason for consult: Follow-up assessment;Infant < 6lbs   Maternal Data    Feeding Feeding Type: Breast Fed Length of feed: 20 min  LATCH Score/Interventions Latch: Grasps breast easily, tongue down, lips flanged, rhythmical sucking. Intervention(s): Adjust position;Assist with latch;Breast massage;Breast compression  Audible Swallowing: A few with stimulation Intervention(s): Skin to skin;Hand expression Intervention(s): Alternate breast massage  Type of Nipple: Everted at rest and after stimulation (short shaft)  Comfort (Breast/Nipple): Filling, red/small blisters or bruises, mild/mod discomfort  Problem noted: Mild/Moderate discomfort Interventions (Mild/moderate discomfort):  Post-pump;Hand massage;Hand expression  Hold (Positioning): Assistance needed to correctly position infant at breast and maintain latch. Intervention(s): Breastfeeding basics reviewed;Support Pillows;Position options;Skin to skin  LATCH Score: 7  Lactation Tools Discussed/Used Tools: Shells;Pump Shell Type: Inverted Breast pump type: Double-Electric Breast Pump   Consult Status Consult Status: Follow-up Date: 06/17/17 Follow-up type: In-patient    Theodoro Kalata 06/16/2017, 6:55 AM

## 2017-06-16 NOTE — Plan of Care (Signed)
Problem: Life Cycle: Goal: Risk for postpartum hemorrhage will decrease Outcome: Progressing Encouraged patient to frequently empty the bladder to decrease the risk of bleeding.

## 2017-06-16 NOTE — Progress Notes (Signed)
Dressing noted to be wet from ice pack/shower. Dressing changed using sterile technique.

## 2017-06-16 NOTE — Plan of Care (Signed)
Problem: Activity: Goal: Risk for activity intolerance will decrease Outcome: Progressing Patient is walking in the hallway today.

## 2017-06-16 NOTE — Plan of Care (Signed)
Problem: Activity: Goal: Will verbalize the importance of balancing activity with adequate rest periods Outcome: Progressing Encourage walking a lap in the hallway.

## 2017-06-16 NOTE — Plan of Care (Signed)
Problem: Pain Managment: Goal: General experience of comfort will improve Outcome: Progressing Encouraged patient to walk in the hallway today to help avoid post operative gas pain.

## 2017-06-17 MED ORDER — OXYCODONE-ACETAMINOPHEN 5-325 MG PO TABS: 1.0000 | ORAL_TABLET | ORAL | 0 refills | Status: AC | PRN

## 2017-06-17 MED ORDER — MAGNESIUM OXIDE 400 (241.3 MG) MG PO TABS: 400.0000 mg | ORAL_TABLET | Freq: Every day | ORAL | 0 refills | Status: DC

## 2017-06-17 MED ORDER — IBUPROFEN 600 MG PO TABS: 600.0000 mg | ORAL_TABLET | Freq: Four times a day (QID) | ORAL | 0 refills | Status: DC | PRN

## 2017-06-17 MED ORDER — POLYSACCHARIDE IRON COMPLEX 150 MG PO CAPS: 150.0000 mg | ORAL_CAPSULE | Freq: Two times a day (BID) | ORAL | 0 refills | Status: DC

## 2017-06-17 MED FILL — OXYCODONE-ACETAMINOPHEN 5-3: 5-325 | 2 days supply | Qty: 20 | Fill #0

## 2017-06-17 NOTE — Lactation Note (Signed)
This note was copied from a baby's chart. Lactation Consultation Note  Patient Name: Ana Davis ITGPQ'D Date: 06/17/2017 Reason for consult: Follow-up assessment;Infant < 6lbs  Mom had baby latched when [facility] arrived. Baby demonstrating good suckling bursts with swallows noted. Mom's breast's slightly engorged. She is pumping right breast while baby is nursing on left breast. Mom latching baby independently, LC reviewed how to un-tuck lower lip for deeper latch. Reviewed engorgement care with Mom. Encouraged to BF on demand, 8-12 times or more in 24 hours. Mom to post pump as needed for comfort, using ice packs. Pre-pump as needed to latch.  Advised to stop using formula to supplement. Keep baby at breast. If supplement needed, parents need to use breast milk. Breasts softening with BF and pumping at this visit. Advised of OP services and support group. Mom to call for questions/concerns. Mom using EBM/coconut oil for nipple tenderness, slight bruising on right nipple.   Maternal Data    Feeding Feeding Type: Breast Fed Length of feed: 30 min  LATCH Score/Interventions Latch: Grasps breast easily, tongue down, lips flanged, rhythmical sucking. Intervention(s): Breast compression;Breast massage;Assist with latch;Adjust position  Audible Swallowing: Spontaneous and intermittent Intervention(s): Hand expression;Skin to skin Intervention(s): Skin to skin;Hand expression;Alternate breast massage  Type of Nipple: Everted at rest and after stimulation  Comfort (Breast/Nipple): Engorged, cracked, bleeding, large blisters, severe discomfort Problem noted: Engorgment Intervention(s): Ice;Hand expression  Problem noted: Mild/Moderate discomfort;Filling Interventions (Filling): Massage;Double electric pump Interventions (Mild/moderate discomfort): Hand massage;Hand expression;Pre-pump if needed;Post-pump  Hold (Positioning): No assistance needed to correctly position infant at  breast. Intervention(s): Skin to skin;Support Pillows;Position options;Breastfeeding basics reviewed  LATCH Score: 8  Lactation Tools Discussed/Used Tools: Pump   Consult Status Consult Status: Complete Date: 06/17/17 Follow-up type: In-patient    Katrine Coho 06/17/2017, 10:17 AM

## 2017-06-17 NOTE — Plan of Care (Signed)
Problem: Skin Integrity: Goal: Demonstration of wound healing without infection will improve Outcome: Progressing Dressing noted to be wet due to ice pack/ bathing. dressing changed using sterile technique, with MD orders.

## 2017-06-17 NOTE — Progress Notes (Signed)
POSTOPERATIVE DAY # 3 S/P CS  S:         Reports feeling well - ready to go home             Tolerating po intake / no nausea / no vomiting / + flatus / no BM             Bleeding is light             Pain controlled with motrin and oxycodone             Up ad lib / ambulatory/ voiding QS  Newborn breast feeding   O:  VS: BP 120/70 (BP Location: Left Arm)   Pulse 80   Temp 98.3 F (36.8 C) (Oral)   Resp 18   Ht 4\' 11"  (1.499 m)   Wt 67.1 kg (148 lb)   LMP 09/12/2016   SpO2 99%   Breastfeeding? Unknown   BMI 29.89 kg/m    LABS:               Recent Labs  06/15/17 0522 06/16/17 0544  WBC 11.5* 12.8*  HGB 7.8* 8.0*  PLT 155 172               Bloodtype: --/--/O NEG (06/19 0522) - rhogam given (newborn RH pos)  Rubella: 2.70 (10/17 1417)                                Physical Exam:             Alert and Oriented X3  Lungs: Clear and unlabored  Heart: regular rate and rhythm / no mumurs  Abdomen: soft, non-tender, non-distended              Fundus: firm, non-tender, Ueven             Dressing intact honeycomb              Incision:  approximated with suture / no erythema / no ecchymosis / no drainage  Perineum: intact  Lochia: light  Extremities: trace edema, no calf pain or tenderness, negative Homans  A:        POD # 3 S/P CS            ABL anemia - s/p IV iron x 1 - RX PNV, iron, magnesium  P:        Routine postoperative care              DC home             WOB booklet - instructions reviewed     Artelia Laroche CNM, MSN, [facility] 06/17/2017, 10:32 AM

## 2017-06-17 NOTE — Discharge Summary (Signed)
POSTOPERATIVE DISCHARGE SUMMARY:  Patient ID: Ana Davis MRN: [id number] DOB/AGE: March 17, 1981 36 y.o.  Admit date: 06/14/2017 Admission Diagnoses: Breech presentation, AMA, GDMa1, suspected fetal chromosomal abnormality  Discharge date:  06/17/2017 Discharge Diagnoses: POD 3 s/p CS / GDMa1-delivered, IDA of pregnancy with compounded ABL anemia  Prenatal history: G1P1001   EDC : 06/19/2017, by Last Menstrual Period  Prenatal care at [facility]  Primary provider : Mody Prenatal course complicated by anxiety, asthma and eczema, AMA, GDMa1, possible chromosomal abnormality, breech  Prenatal Labs: ABO, Rh: --/--/O NEG (06/19 0522) / Rhophylac given prior to DC (baby RH pos) Antibody: POS (06/15 1120) Rubella: 2.70 (10/17 1417)  immune RPR: Non Reactive (06/15 1120)  HBsAg: NEGATIVE (10/17 1417)  HIV: NONREACTIVE (10/17 1417)  GBS:     Medical / Surgical History :  Past medical history:  Past Medical History:  Diagnosis Date  . Anxiety    claustrophobic  . Asthma    childhood  . Breech presentation 06/13/2017  . Eczema   . Fibroid   . GDM, class A1 06/13/2017  . Genital warts 2013   tx with aldara generic  . Gestational diabetes   . IBS (irritable bowel syndrome)   . Vaginal Pap smear, abnormal     Past surgical history:  Past Surgical History:  Procedure Laterality Date  . CESAREAN SECTION N/A 06/14/2017   Procedure: Primary CESAREAN SECTION;  Surgeon: Azucena Fallen, MD;  Location: [place];  Service: Obstetrics;  Laterality: N/A;  EDD: 06/20/17 Allergy: Codeine  . COLONOSCOPY  08/2015  . NO PAST SURGERIES    . WISDOM TOOTH EXTRACTION      Family History:  Family History  Problem Relation Age of Onset  . Cancer Maternal Grandmother        great gran- breast  . Seizures Maternal Grandmother   . Diabetes Maternal Grandfather   . Kidney disease Maternal Grandfather   . Hypertension Father   . Arthritis Paternal Grandfather      Social History:  reports that she has never smoked. She has never used smokeless tobacco. She reports that she drinks alcohol. She reports that she does not use drugs.  Allergies: Hydrocodone   Current Medications at time of admission:  Prior to Admission medications   Medication Sig Start Date End Date Taking? Authorizing Provider  acetaminophen (TYLENOL) 500 MG tablet Take 500-1,000 mg by mouth 2 (two) times daily as needed for mild pain.   Yes [provider]  albuterol (PROVENTIL HFA;VENTOLIN HFA) 108 (90 Base) MCG/ACT inhaler Inhale 2 puffs into the lungs every 6 (six) hours as needed for wheezing or shortness of breath.   Yes [provider]  cetirizine (ZYRTEC) 10 MG tablet Take 10 mg by mouth daily as needed for allergies.    Yes [provider]  cyclobenzaprine (FLEXERIL) 10 MG tablet Take 1 tablet (10 mg total) by mouth every 8 (eight) hours as needed for muscle spasms. Patient taking differently: Take 10 mg by mouth at bedtime as needed for muscle spasms.  12/08/16  Yes Leftwich-Kirby, Kathie Dike, CNM  ferrous sulfate 325 (65 FE) MG tablet Take 325 mg by mouth daily with breakfast.   Yes [provider]  Fiber POWD Take by mouth daily as needed (constipation). 1 tablespoon   Yes [provider]  oxyCODONE-acetaminophen (PERCOCET/ROXICET) 5-325 MG tablet Take 1 tablet by mouth at bedtime as needed for severe pain.   Yes [provider]  Prenatal Vit-Fe Fumarate-FA (MULTIVITAMIN-PRENATAL)  27-0.8 MG TABS tablet Take 1 tablet by mouth daily.    Yes [provider]   Procedures: Cesarean section delivery on 06/14/2017 with delivery of viable female newborn by Dr Benjie Karvonen   See operative report for further details APGAR (1 MIN): 8   APGAR (5 MINS): 9    Postoperative / postpartum course:  Uncomplicated with discharge on POD 3 Complicated by: ABL anemia  Discharge Instructions:  Discharged Condition: stable  Activity:  pelvic rest and postoperative restrictions x 2   Diet: routine  Medications:  Allergies as of 06/17/2017      Reactions   Hydrocodone    Hallucinations      Medication List    STOP taking these medications   ferrous sulfate 325 (65 FE) MG tablet     TAKE these medications   acetaminophen 500 MG tablet Commonly known as:  TYLENOL Take 500-1,000 mg by mouth 2 (two) times daily as needed for mild pain.   albuterol 108 (90 Base) MCG/ACT inhaler Commonly known as:  PROVENTIL HFA;VENTOLIN HFA Inhale 2 puffs into the lungs every 6 (six) hours as needed for wheezing or shortness of breath.   cetirizine 10 MG tablet Commonly known as:  ZYRTEC Take 10 mg by mouth daily as needed for allergies.   cyclobenzaprine 10 MG tablet Commonly known as:  FLEXERIL Take 1 tablet (10 mg total) by mouth every 8 (eight) hours as needed for muscle spasms. What changed:  when to take this   Fiber Powd Take by mouth daily as needed (constipation). 1 tablespoon   ibuprofen 600 MG tablet Commonly known as:  ADVIL,MOTRIN Take 1 tablet (600 mg total) by mouth every 6 (six) hours as needed for mild pain.   iron polysaccharides 150 MG capsule Commonly known as:  NIFEREX Take 1 capsule (150 mg total) by mouth 2 (two) times daily.   magnesium oxide 400 (241.3 Mg) MG tablet Commonly known as:  MAG-OX Take 1 tablet (400 mg total) by mouth daily. Start taking on:  06/18/2017   multivitamin-prenatal 27-0.8 MG Tabs tablet Take 1 tablet by mouth daily.   oxyCODONE-acetaminophen 5-325 MG tablet Commonly known as:  PERCOCET/ROXICET Take 1 tablet by mouth at bedtime as needed for severe pain.   terconazole 0.4 % vaginal cream Commonly known as:  TERAZOL 7 Place 1 applicator vaginally at bedtime.       Wound Care: keep clean and dry / remove honeycomb POD 5 Postpartum Instructions: [facility] discharge booklet - instructions reviewed  Discharge to: Home  Follow up :  [facility] in 6weeks for  interval visit with Dr Benjie Karvonen for PP visits         Signed: Artelia Laroche CNM, MSN, [facility] 06/17/2017, 11:19 AM

## 2017-06-24 ENCOUNTER — Encounter (HOSPITAL_COMMUNITY): Payer: Self-pay | Admitting: *Deleted

## 2017-06-24 ENCOUNTER — Observation Stay (HOSPITAL_COMMUNITY)
Admission: AD | Admit: 2017-06-24 | Discharge: 2017-06-26 | Disposition: A | Discharge status: Home / Self Care | Payer: 59 | Source: Ambulatory Visit | Attending: Obstetrics & Gynecology | Admitting: Obstetrics & Gynecology

## 2017-06-24 ENCOUNTER — Inpatient Hospital Stay (HOSPITAL_COMMUNITY): Payer: 59

## 2017-06-24 DIAGNOSIS — J45909 Unspecified asthma, uncomplicated: Secondary | ICD-10-CM | POA: Insufficient documentation

## 2017-06-24 DIAGNOSIS — Z79899 Other long term (current) drug therapy: Secondary | ICD-10-CM | POA: Insufficient documentation

## 2017-06-24 DIAGNOSIS — N719 Inflammatory disease of uterus, unspecified: Secondary | ICD-10-CM | POA: Diagnosis not present

## 2017-06-24 DIAGNOSIS — O8612 Endometritis following delivery: Principal | ICD-10-CM | POA: Insufficient documentation

## 2017-06-24 DIAGNOSIS — D259 Leiomyoma of uterus, unspecified: Secondary | ICD-10-CM | POA: Diagnosis not present

## 2017-06-24 DIAGNOSIS — N6459 Other signs and symptoms in breast: Secondary | ICD-10-CM | POA: Diagnosis not present

## 2017-06-24 DIAGNOSIS — B9689 Other specified bacterial agents as the cause of diseases classified elsewhere: Secondary | ICD-10-CM | POA: Insufficient documentation

## 2017-06-24 DIAGNOSIS — N898 Other specified noninflammatory disorders of vagina: Secondary | ICD-10-CM | POA: Diagnosis not present

## 2017-06-24 LAB — URINALYSIS, ROUTINE W REFLEX MICROSCOPIC
Bacteria, UA: NONE SEEN
Bilirubin Urine: NEGATIVE
GLUCOSE, UA: NEGATIVE mg/dL
Ketones, ur: 20 mg/dL — AB
NITRITE: NEGATIVE
PROTEIN: NEGATIVE mg/dL
Specific Gravity, Urine: 1.005 (ref 1.005–1.030)
pH: 5 (ref 5.0–8.0)

## 2017-06-24 LAB — CBC WITH DIFFERENTIAL/PLATELET
Basophils Absolute: 0 10*3/uL (ref 0.0–0.1)
Basophils Relative: 0 %
EOS ABS: 0.2 10*3/uL (ref 0.0–0.7)
Eosinophils Relative: 2 %
HCT: 32.6 % — ABNORMAL LOW (ref 36.0–46.0)
HEMOGLOBIN: 11.1 g/dL — AB (ref 12.0–15.0)
Lymphocytes Relative: 8 %
Lymphs Abs: 0.8 10*3/uL (ref 0.7–4.0)
MCH: 32.1 pg (ref 26.0–34.0)
MCHC: 34 g/dL (ref 30.0–36.0)
MCV: 94.2 fL (ref 78.0–100.0)
Monocytes Absolute: 0.2 10*3/uL (ref 0.1–1.0)
Monocytes Relative: 2 %
NEUTROS PCT: 88 %
Neutro Abs: 8.7 10*3/uL — ABNORMAL HIGH (ref 1.7–7.7)
Platelets: 313 10*3/uL (ref 150–400)
RBC: 3.46 MIL/uL — AB (ref 3.87–5.11)
RDW: 14.3 % (ref 11.5–15.5)
WBC: 9.9 10*3/uL (ref 4.0–10.5)

## 2017-06-24 MED ORDER — ACETAMINOPHEN 650 MG RE SUPP: 650.0000 mg | Freq: Four times a day (QID) | RECTAL | Status: DC | PRN

## 2017-06-24 MED ORDER — SODIUM CHLORIDE 0.9% FLUSH
3.0000 mL | INTRAVENOUS | Status: DC | PRN
  Administered 2017-06-24 – 2017-06-25 (×2): 3 mL via INTRAVENOUS
  Filled 2017-06-24 (×2): qty 3

## 2017-06-24 MED ORDER — SODIUM CHLORIDE 0.9 % IV SOLN: 250.0000 mL | INTRAVENOUS | Status: DC | PRN

## 2017-06-24 MED ORDER — IBUPROFEN 600 MG PO TABS
600.0000 mg | ORAL_TABLET | Freq: Four times a day (QID) | ORAL | Status: DC | PRN
  Administered 2017-06-24 – 2017-06-25 (×2): 600 mg via ORAL
  Filled 2017-06-24 (×2): qty 1

## 2017-06-24 MED ORDER — SODIUM CHLORIDE 0.9% FLUSH
3.0000 mL | Freq: Two times a day (BID) | INTRAVENOUS | Status: DC
  Administered 2017-06-24: 3 mL via INTRAVENOUS

## 2017-06-24 MED ORDER — CEFOXITIN SODIUM 2 G IV SOLR
2.0000 g | Freq: Three times a day (TID) | INTRAVENOUS | Status: DC
  Administered 2017-06-24 – 2017-06-25 (×2): 2 g via INTRAVENOUS
  Filled 2017-06-24 (×3): qty 2

## 2017-06-24 MED ORDER — ACETAMINOPHEN 325 MG PO TABS
650.0000 mg | ORAL_TABLET | Freq: Four times a day (QID) | ORAL | Status: DC | PRN
  Administered 2017-06-25 (×2): 650 mg via ORAL
  Filled 2017-06-24 (×2): qty 2

## 2017-06-24 NOTE — H&P (Signed)
Ana Davis is an 36 y.o. female G1P1001 s/p Primary elective C-section at 56 wks for breech on 06/14/17. Surgery was uncomplicated and discharge was on post-op day 3 with the baby.  She is here with c/o fever since yesterday, was 102 today, chills and worsening lower abdominal pain that is different from her incisional pain which had improved a lot since discharge. She has small amount of lochia with no heavy bleeding or abnormal foul discharge. Denies intercourse since discharge.   Denies HA/ cough/sick contact/ CP/SOB/N/V/ LE pain or swelling. Breast feeding well, breasts very engorged when seen in office by Dr Murrell Redden.  She has Hx of fibroids, anemia, post-C/section acute blood loss anemia.  No LMP recorded.    Past Medical History:  Diagnosis Date  . Anxiety    claustrophobic  . Asthma    childhood  . Breech presentation 06/13/2017  . Eczema   . Fibroid   . GDM, class A1 06/13/2017  . Genital warts 2013   tx with aldara generic  . Gestational diabetes   . IBS (irritable bowel syndrome)   . Vaginal Pap smear, abnormal     Past Surgical History:  Procedure Laterality Date  . CESAREAN SECTION N/A 06/14/2017   Procedure: Primary CESAREAN SECTION;  Surgeon: Azucena Fallen, MD;  Location: [place];  Service: Obstetrics;  Laterality: N/A;  EDD: 06/20/17 Allergy: Codeine  . COLONOSCOPY  08/2015  . WISDOM TOOTH EXTRACTION      Family History  Problem Relation Age of Onset  . Cancer Maternal Grandmother        great gran- breast  . Seizures Maternal Grandmother   . Diabetes Maternal Grandfather   . Kidney disease Maternal Grandfather   . Hypertension Father   . Arthritis Paternal Grandfather     Social History:  reports that she has never smoked. She has never used smokeless tobacco. She reports that she drinks alcohol. She reports that she does not use drugs.  Allergies:  Allergies  Allergen Reactions  . Hydrocodone     Hallucinations     Prescriptions Prior to Admission  Medication Sig Dispense Refill Last Dose  . acetaminophen (TYLENOL) 500 MG tablet Take 500-1,000 mg by mouth 2 (two) times daily as needed for mild pain.   Past Week at Unknown time  . cyclobenzaprine (FLEXERIL) 10 MG tablet Take 1 tablet (10 mg total) by mouth every 8 (eight) hours as needed for muscle spasms. (Patient taking differently: Take 10 mg by mouth at bedtime as needed for muscle spasms. ) 30 tablet 0 Past Week at Unknown time  . ibuprofen (ADVIL,MOTRIN) 600 MG tablet Take 1 tablet (600 mg total) by mouth every 6 (six) hours as needed for mild pain. 30 tablet 0 06/24/2017 at Unknown time  . iron polysaccharides (NIFEREX) 150 MG capsule Take 1 capsule (150 mg total) by mouth 2 (two) times daily. 45 capsule 0 Past Week at Unknown time  . magnesium oxide (MAG-OX) 400 (241.3 Mg) MG tablet Take 1 tablet (400 mg total) by mouth daily. 45 tablet 0 06/23/2017 at Unknown time  . oxyCODONE-acetaminophen (PERCOCET/ROXICET) 5-325 MG tablet Take 1 tablet by mouth every 4 (four) hours as needed for severe pain.   06/24/2017 at Unknown time  . Prenatal Vit-Fe Fumarate-FA (MULTIVITAMIN-PRENATAL) 27-0.8 MG TABS tablet Take 1 tablet by mouth daily.    06/23/2017 at Unknown time  . albuterol (PROVENTIL HFA;VENTOLIN HFA) 108 (90 Base) MCG/ACT inhaler Inhale 2 puffs into the lungs every 6 (six) hours  as needed for wheezing or shortness of breath.   rescue  . cetirizine (ZYRTEC) 10 MG tablet Take 10 mg by mouth daily as needed for allergies.    Not Taking at Unknown time  . Fiber POWD Take by mouth daily as needed (constipation). 1 tablespoon   Not Taking at Unknown time  . terconazole (TERAZOL 7) 0.4 % vaginal cream Place 1 applicator vaginally at bedtime. (Patient not taking: Reported on 05/03/2017) 45 g 0 Not Taking    ROS see above   Physical Exam  Blood pressure 110/63, pulse 96, temperature 98.8 F (37.1 C), temperature source Oral, resp. rate 18, SpO2 98 %,pt is  currently breastfeeding. Temp:  [98.8 F (37.1 C)-99.4 F (37.4 C)] 98.8 F (37.1 C) (06/28 1927) Pulse Rate:  [96] 96 (06/28 1820) Resp:  [18] 18 (06/28 1820) BP: (110)/(63) 110/63 (06/28 1820) SpO2:  [98 %] 98 % (06/28 1820) A&O x 3, no acute distress. Pleasant HEENT neg Lungs CTA bilat CV RRR, S1S2 normal Breasts - normal, no redness or mass or tenderness (s/p pumping 2 large bottles of milk)  Abdo Tenderness limited to the uterus only, no incisional and any other tenderness, abdomen is soft, no guarding or rebound. Incision well healed with no redness/ tenderness  Extr no edema/ tenderness bilateral Pelvic deferred but per Dr Murrell Redden, office exam- tender uterus and positive CMT  Results for orders placed or performed during the hospital encounter of 06/24/17 (from the past 24 hour(s))  CBC with Differential     Status: Abnormal   Collection Time: 06/24/17  6:13 PM  Result Value Ref Range   WBC 9.9 4.0 - 10.5 K/uL   RBC 3.46 (L) 3.87 - 5.11 MIL/uL   Hemoglobin 11.1 (L) 12.0 - 15.0 g/dL   HCT 32.6 (L) 36.0 - 46.0 %   MCV 94.2 78.0 - 100.0 fL   MCH 32.1 26.0 - 34.0 pg   MCHC 34.0 30.0 - 36.0 g/dL   RDW 14.3 11.5 - 15.5 %   Platelets 313 150 - 400 K/uL   Neutrophils Relative % 88 %   Neutro Abs 8.7 (H) 1.7 - 7.7 K/uL   Lymphocytes Relative 8 %   Lymphs Abs 0.8 0.7 - 4.0 K/uL   Monocytes Relative 2 %   Monocytes Absolute 0.2 0.1 - 1.0 K/uL   Eosinophils Relative 2 %   Eosinophils Absolute 0.2 0.0 - 0.7 K/uL   Basophils Relative 0 %   Basophils Absolute 0.0 0.0 - 0.1 K/uL   Urinalysis    Component Value Date/Time   COLORURINE YELLOW 12/08/2016 0805   APPEARANCEUR HAZY (A) 12/08/2016 0805   LABSPEC 1.025 12/08/2016 0805   PHURINE 5.0 12/08/2016 0805   GLUCOSEU NEGATIVE 12/08/2016 0805   HGBUR SMALL (A) 12/08/2016 0805   BILIRUBINUR NEGATIVE 12/08/2016 0805   KETONESUR 20 (A) 12/08/2016 0805   PROTEINUR 30 (A) 12/08/2016 0805   NITRITE NEGATIVE 12/08/2016 0805    LEUKOCYTESUR LARGE (A) 12/08/2016 0805     US Pelvis Complete  Result Date: 06/24/2017 CLINICAL DATA:  C-section June 14, 2017 which fever but no white count. EXAM: TRANSABDOMINAL ULTRASOUND OF PELVIS TECHNIQUE: Transabdominal ultrasound examination of the pelvis was performed including evaluation of the uterus, ovaries, adnexal regions, and pelvic cul-de-sac. COMPARISON:  None. FINDINGS: Uterus Measurements: 12.6 x 7.2 x 11.2 cm. At least 5 fibroids are identified. The largest is seen to the right measuring 6.5 x 5.2 x 5 cm. Endometrium The endometrium is not particularly discrete and is  difficult to accurately measure. However, it appears to measure approximately 20 mm. Right ovary Measurements: 2.6 x 1.4 x 1.4 cm. Normal appearance/no adnexal mass. Left ovary Not visualized. Other findings:  No abnormal free fluid. IMPRESSION: 1. At least 5 fibroids are seen in the uterus with the largest measuring up to 6.5 cm. 2. The endometrium is poorly evaluated on today's study but measures approximately 20 mm. No focal masses seen. 3. The left ovary was not visualized. Electronically Signed   By: Dorise Bullion III M.D   On: 06/24/2017 19:46    Assessment/Plan: Post-op C-section day 10 with puerperal fever, likely endometritis although no elevated WBC noted.  Breast engorgement related fever ? though abdominal tenderness remains and endometritis is more likely her current diagnosis.  Admit for IV antibiotics for 24 hrs if no more fever noted and longer if spikes fever again.  IV Cefoxitin 2 gm q 8 hrs, analgesics as needed.  Needs to pump every 3-4 hours  Sirinity Outland R 06/24/2017, 8:35 PM

## 2017-06-24 NOTE — MAU Note (Signed)
Delivered on 06-14-17 via g-section; c/o pain inside her c-section incision; pt is having flu-like symptoms since last night and she has a fever since last night also;

## 2017-06-24 NOTE — MAU Note (Signed)
Pt sent from Dr. Gardiner Coins office, had C/S on June 18, states MD thinks she may have a uterine infection.  Started feeling very hot last night & having pain all over, increased pain in incision.  Has severe pain in lower abd with urination.  Temp this afternoon was 102.

## 2017-06-25 DIAGNOSIS — B9689 Other specified bacterial agents as the cause of diseases classified elsewhere: Secondary | ICD-10-CM | POA: Diagnosis not present

## 2017-06-25 DIAGNOSIS — Z79899 Other long term (current) drug therapy: Secondary | ICD-10-CM | POA: Diagnosis not present

## 2017-06-25 DIAGNOSIS — J45909 Unspecified asthma, uncomplicated: Secondary | ICD-10-CM | POA: Diagnosis not present

## 2017-06-25 DIAGNOSIS — O8612 Endometritis following delivery: Secondary | ICD-10-CM | POA: Diagnosis not present

## 2017-06-25 MED ORDER — DEXTROSE 5 % IV SOLN
2.0000 g | Freq: Four times a day (QID) | INTRAVENOUS | Status: DC
  Administered 2017-06-25 – 2017-06-26 (×5): 2 g via INTRAVENOUS
  Filled 2017-06-25 (×7): qty 2

## 2017-06-25 NOTE — Progress Notes (Signed)
Subjective: Slept overnight but chills this morning   Objective: Vital signs in last 24 hours: Patient Vitals for the past 24 hrs:  BP Temp Temp src Pulse Resp SpO2 Height Weight  06/25/17 0800 117/68 (!) 100.8 F (38.2 C) Oral 85 16 98 % - -  06/25/17 0634 - 99.7 F (37.6 C) Oral - - - - -  06/25/17 0344 115/70 99.7 F (37.6 C) Oral 87 16 100 % - -  06/24/17 2322 101/60 99.5 F (37.5 C) Oral 90 15 99 % - -  06/24/17 2107 114/70 99.2 F (37.3 C) Oral 83 15 99 % 4\' 11"  (1.499 m) 133 lb 0.8 oz (60.4 kg)  06/24/17 1927 - 98.8 F (37.1 C) Oral - - - - -  06/24/17 1820 110/63 99.4 F (37.4 C) Oral 96 18 98 % - -    General appearance: alert and cooperative Back: negative, symmetric, no curvature. ROM normal. No CVA tenderness. Resp: clear to auscultation bilaterally Breasts: normal appearance, no masses or tenderness Cardio: regular rate and rhythm, S1, S2 normal, no murmur, click, rub or gallop GI: soft, non-tender; bowel sounds normal; no masses,  no organomegaly Pelvic: no abn discharge  Extremities: extremities normal, atraumatic, no cyanosis or edema and Homans sign is negative, no sign of DVT Incision/Wound: C-section incision CDI Uterine tenderness is still the only focal finding   Lab Result : no new labs   Medications:  .  acetaminophen (TYLENOL) tablet 650 mg **OR** acetaminophen (TYLENOL) suppository 650 mg .  ibuprofen (ADVIL,MOTRIN) tablet 600 mg .  0.9 %  sodium chloride infusion .  cefOXitin (MEFOXIN) 2 g in dextrose 5 % 50 mL IVPB  Assessment/Plan: HD 1.  Post- C/section D11, Fever, likely Endometritis with uterine tenderness and no other focal findings Continue Cefoxitin 2 gm q 6 hrs and change to Gent/Clinda if not better after 48hrs  Breast feeding. Pump regularly    LOS: 1 day   Burr Soffer R 06/25/2017, 9:37 AM Patient ID: Ana Davis, female   DOB: [birth year], 36 y.o.   MRN: [id number]

## 2017-06-25 NOTE — Progress Notes (Signed)
Patient has a temp 100.8. Patient medicated with 650mg  of Tylenol. Toya Smothers, RN

## 2017-06-26 ENCOUNTER — Encounter (HOSPITAL_COMMUNITY): Payer: Self-pay

## 2017-06-26 DIAGNOSIS — O8612 Endometritis following delivery: Secondary | ICD-10-CM | POA: Diagnosis not present

## 2017-06-26 DIAGNOSIS — B9689 Other specified bacterial agents as the cause of diseases classified elsewhere: Secondary | ICD-10-CM | POA: Diagnosis not present

## 2017-06-26 DIAGNOSIS — Z79899 Other long term (current) drug therapy: Secondary | ICD-10-CM | POA: Diagnosis not present

## 2017-06-26 DIAGNOSIS — J45909 Unspecified asthma, uncomplicated: Secondary | ICD-10-CM | POA: Diagnosis not present

## 2017-06-26 MED ORDER — AMOXICILLIN-POT CLAVULANATE 875-125 MG PO TABS: 1.0000 | ORAL_TABLET | Freq: Two times a day (BID) | ORAL | 0 refills | Status: AC

## 2017-06-26 NOTE — Lactation Note (Signed)
Lactation Consultation Note  Patient Name: Ana Davis Date: 06/26/2017  Mom was readmitted 06/24/17; baby was born 06/14/17 so is 8 days old now. Mom reports she has been BF and pumping. Mom reports when she is with baby, she latches him for 15-30 mins/ breast and then sometimes gives 3-4 oz of breastmilk or formula (if no breastmilk pumped) if he wakes up ~30-60 mins later acting hungry. Mom reports when she is not with baby (for example, during this hospital stay), she would pump when her breasts would fill up but not on a regular schedule (mom reports this would sometimes be q 4-5hrs because mom reports she doesn't feel as full anymore). Mom reports pumping for 20-30 mins/ breast and gets ~2.5oz total. Mom reports she started using a nipple shield she bought after discharge due to sore nipples. Mom reports she still has sore nipples sometimes but not like she was before she started using the nipple shield. Mom unsure whether nipple shield is correct size. Mom is concerned about her milk supply so has eaten some 'lactation brownies.' Mom reports that her mother-in-law is staying with them for the next month and so her mother-in-law has been helping take care of baby at night sometimes so mom and her husband can get sleep. Discussed importance of BF and/ or pumping 8-12x in 24hrs; mom reports she will wake up to pump some nights if her breasts start to feel full. Discussed importance of ensuring baby is suckling & swallowing for at least 15-30 mins on one breast and to offer her second breast at each feeding to help her milk supply. Discussed stomach size and that even if he just BF 30 mins ago, she could put him back to her breast instead of feeding more than 3 oz from a bottle. Encouraged mom to post pump while using the nipple shield and that it is a temporary tool that she should try BF without it occ. Encouraged pumping no more than 15-20 mins and to try making a hands free bra to pump  both breasts at the same time and massage her breasts while pumping. Encouraged hand expressing after pumping. Offered to come see a latch before discharge- encouraged mom to ask her nurse for [facility] before next feeding. Mom reports no other questions.    Maternal Data    Feeding    [facility] Score/Interventions                      Lactation Tools Discussed/Used     Consult Status      Yvonna Alanis 06/26/2017, 11:05 AM

## 2017-06-26 NOTE — Progress Notes (Signed)
Discharge instructions given to patient and understanding verbalized.

## 2017-06-26 NOTE — Progress Notes (Signed)
PC received from patient stating that her pharmacy where prescription was e-scribed is closed and she requested that it be called into [facility] on [address] Dr Pamala Hurry called and updated and Rn called in Augmentin [phone] one po every 12 hours for 14 days, #[phone] to [place] [phone]. Patient called back and informed.

## 2017-06-26 NOTE — Progress Notes (Signed)
HD#3  Pt notes still cramping when breastfeeding, otherwise feeling overall well. Ready for d/c. Last fever 8 am yesterday, no other fevers yesterday. Tolerating reg po, + flatus. Minimal vaginal bleeding. Breastffeding, some nipple pain, no breast pain or redness, using nipple shields. No dysuria, no CP, no SOB.  PE:   Vitals:   06/25/17 1931 06/25/17 2354 06/26/17 0430 06/26/17 0828  BP: 111/64 113/71 107/66 123/63  Pulse: 79 85 67 64  Resp: 17 18 19 18   Temp: 99.2 F (37.3 C) 98.6 F (37 C) 98.9 F (37.2 C) 98 F (36.7 C)  TempSrc: Oral Oral Oral Oral  SpO2: 98% 100% 98% 97%  Weight:      Height:       Gen: well appearing, no distress CV: RRR Pulm: CTAB Abd: soft distension, no tympany, no rebound, no guarding, minimal uterine tenderness, no CVAT Inc: C/D/I LE: NT, no edema  CBC    Component Value Date/Time   WBC 9.9 06/24/2017 1813   RBC 3.46 (L) 06/24/2017 1813   HGB 11.1 (L) 06/24/2017 1813   HCT 32.6 (L) 06/24/2017 1813   PLT 313 06/24/2017 1813   MCV 94.2 06/24/2017 1813   MCH 32.1 06/24/2017 1813   MCHC 34.0 06/24/2017 1813   RDW 14.3 06/24/2017 1813   LYMPHSABS 0.8 06/24/2017 1813   MONOABS 0.2 06/24/2017 1813   EOSABS 0.2 06/24/2017 1813   BASOSABS 0.0 06/24/2017 1813    A/P:   Post- C/section D12, Fever, likely Endometritis with uterine tenderness and no other focal findings. Much improved on Continue Cefoxitin 2 gm q 6 hrs. Will plan 1 more dose this morning then d/c home on Augmentin and f/u in 1 wk. Pt aware to call with new fevers or worsening pain.   Breast feeding. Pump regularly. Routine post-op care. NPV.  Ana Davis A. 06/26/2017 9:56 AM

## 2017-06-26 NOTE — Lactation Note (Signed)
Lactation Consultation Note  Patient Name: Ana Davis Date: 06/26/2017  [facility] called because mom wanted help with latch before going home. When LC was able to see mom & baby, mom stated she had already fed baby but that he was still acting fussy so LC suggested mom try latching again. Mom reports that he fed for ~15 mins on both breasts with the nipple shield but that it was painful on the right breast (pinching/ biting). Suggested mom try on her right breast again. Mom's nipples are everted at rest & after stimulation so suggested mom try without the nipple shield first. Mom tried latching him in the cradle hold with baby's stomach facing the ceiling. Mom was able to hand express a few drops of milk and baby started opening his mouth but would not open very wide. Suggested mom try cross-cradle hold and roll his body to face her body and keep his ear, shoulder, and hip in a straight line. Baby still wouldn't open wide so when baby did latch with a narrow latch, showed mom how to apply slight pressure to his chin while moving his head closer to have him open wider as well as flanging his top lip. Mom reported more comfort and swallows were noted. After ~5 mins, she stated it was becoming more pinchy so LC again applied some pressure on his chin and he deepened his latch, which mom stated was better. Discussed how FOB could help do this during a feed and that she should continue to monitor her comfort during a feeding and either unlatch to relatch or help him achieve a deeper latch if the latch is uncomforatble. Baby needed stimulation to continue suckling. Discussed importance of keeping him awake and swallowing for at least 15-30 mins on one breast before switching to the other. Discussed how he may be used to the bottle nipple & pacifier and since he has a smaller mouth, she may need to try just BF with no bottle or pacifier for ~24hrs to increase her supply & get him more comfortable with BF  with a wide latch. Discussed how she could use the nipple shield if there were feedings that were more difficult but that with more practice & time, his mouth will grow and he'll become better at latching without it. Discussed how if she does use the nipple shield, she should pump afterwards for 15-20 mins ~4x/d. Also discussed that if she is still using a nipple shield in the next week or so, that it would be good to set up an outpatient lactation appointment. Mom encouraged to feed baby 8-12 times/24 hours and with feeding cues.  Mom reports no questions at this time. Encouraged mom to call outpatient [place] number if she has any later.    Maternal Data    Feeding    [facility] Score/Interventions                      Lactation Tools Discussed/Used     Consult Status      Yvonna Alanis 06/26/2017, 1:26 PM

## 2017-06-30 NOTE — Discharge Summary (Signed)
Ana Davis MRN: [id number] DOB/AGE: September 13, 1981 36 y.o.  Admit date: 06/24/2017 Discharge date: 06/26/17  Admission Diagnoses: PP FEVER, LABS, CBC, ULTRASOUND  Discharge Diagnoses: PP FEVER, LABS, CBC, ULTRASOUND        Active Problems:   Endometritis following delivery   Discharged Condition: good  Hospital Course: admitted for IV antibiotics, improved over time.   Consults: None  Treatments: antibiotics: cefoxitin  Disposition: 01-Home or Self Care   Allergies as of 06/26/2017      Reactions   Hydrocodone    Hallucinations      Medication List    STOP taking these medications   terconazole 0.4 % vaginal cream Commonly known as:  TERAZOL 7     TAKE these medications   acetaminophen 500 MG tablet Commonly known as:  TYLENOL Take 500-1,000 mg by mouth 2 (two) times daily as needed for mild pain.   albuterol 108 (90 Base) MCG/ACT inhaler Commonly known as:  PROVENTIL HFA;VENTOLIN HFA Inhale 2 puffs into the lungs every 6 (six) hours as needed for wheezing or shortness of breath.   amoxicillin-clavulanate 875-125 MG tablet Commonly known as:  AUGMENTIN Take 1 tablet by mouth every 12 (twelve) hours.   cetirizine 10 MG tablet Commonly known as:  ZYRTEC Take 10 mg by mouth daily as needed for allergies.   cyclobenzaprine 10 MG tablet Commonly known as:  FLEXERIL Take 1 tablet (10 mg total) by mouth every 8 (eight) hours as needed for muscle spasms. What changed:  when to take this   Fiber Powd Take by mouth daily as needed (constipation). 1 tablespoon   ibuprofen 600 MG tablet Commonly known as:  ADVIL,MOTRIN Take 1 tablet (600 mg total) by mouth every 6 (six) hours as needed for mild pain.   iron polysaccharides 150 MG capsule Commonly known as:  NIFEREX Take 1 capsule (150 mg total) by mouth 2 (two) times daily.   magnesium oxide 400 (241.3 Mg) MG tablet Commonly known as:  MAG-OX Take 1 tablet (400 mg total) by mouth daily.    multivitamin-prenatal 27-0.8 MG Tabs tablet Take 1 tablet by mouth daily.   oxyCODONE-acetaminophen 5-325 MG tablet Commonly known as:  PERCOCET/ROXICET Take 1 tablet by mouth every 4 (four) hours as needed for severe pain.        Signed: Ala Dach., MD 06/30/2017, 6:48 PM

## 2017-07-28 DIAGNOSIS — Z1151 Encounter for screening for human papillomavirus (HPV): Secondary | ICD-10-CM | POA: Diagnosis not present

## 2017-07-28 MED FILL — NORETHINDRONE 0.35 MG TAB: 0.35 | 28 days supply | Qty: 28 | Fill #0

## 2017-08-23 MED FILL — NORETHINDRONE 0.35 MG TAB: 0.35 | 84 days supply | Qty: 84 | Fill #1

## 2017-09-09 DIAGNOSIS — M6281 Muscle weakness (generalized): Secondary | ICD-10-CM | POA: Diagnosis not present

## 2017-09-09 DIAGNOSIS — M545 Low back pain: Secondary | ICD-10-CM | POA: Diagnosis not present

## 2017-09-14 DIAGNOSIS — M6281 Muscle weakness (generalized): Secondary | ICD-10-CM | POA: Diagnosis not present

## 2017-09-14 DIAGNOSIS — M545 Low back pain: Secondary | ICD-10-CM | POA: Diagnosis not present

## 2017-09-16 DIAGNOSIS — M545 Low back pain: Secondary | ICD-10-CM | POA: Diagnosis not present

## 2017-09-16 DIAGNOSIS — M6281 Muscle weakness (generalized): Secondary | ICD-10-CM | POA: Diagnosis not present

## 2017-09-21 DIAGNOSIS — M545 Low back pain: Secondary | ICD-10-CM | POA: Diagnosis not present

## 2017-09-21 DIAGNOSIS — M6281 Muscle weakness (generalized): Secondary | ICD-10-CM | POA: Diagnosis not present

## 2017-09-23 DIAGNOSIS — M545 Low back pain: Secondary | ICD-10-CM | POA: Diagnosis not present

## 2017-09-23 DIAGNOSIS — M6281 Muscle weakness (generalized): Secondary | ICD-10-CM | POA: Diagnosis not present

## 2017-09-28 DIAGNOSIS — M6281 Muscle weakness (generalized): Secondary | ICD-10-CM | POA: Diagnosis not present

## 2017-09-28 DIAGNOSIS — M545 Low back pain: Secondary | ICD-10-CM | POA: Diagnosis not present

## 2017-09-30 DIAGNOSIS — M545 Low back pain: Secondary | ICD-10-CM | POA: Diagnosis not present

## 2017-09-30 DIAGNOSIS — M6281 Muscle weakness (generalized): Secondary | ICD-10-CM | POA: Diagnosis not present

## 2017-10-05 DIAGNOSIS — M545 Low back pain: Secondary | ICD-10-CM | POA: Diagnosis not present

## 2017-10-05 DIAGNOSIS — M6281 Muscle weakness (generalized): Secondary | ICD-10-CM | POA: Diagnosis not present

## 2017-10-14 DIAGNOSIS — M545 Low back pain: Secondary | ICD-10-CM | POA: Diagnosis not present

## 2017-10-14 DIAGNOSIS — M6281 Muscle weakness (generalized): Secondary | ICD-10-CM | POA: Diagnosis not present

## 2017-10-19 DIAGNOSIS — M545 Low back pain: Secondary | ICD-10-CM | POA: Diagnosis not present

## 2017-10-19 DIAGNOSIS — M6281 Muscle weakness (generalized): Secondary | ICD-10-CM | POA: Diagnosis not present

## 2017-11-16 MED FILL — NORETHINDRONE 0.35 MG TAB: 0.35 | 84 days supply | Qty: 84 | Fill #2

## 2018-01-25 DIAGNOSIS — Z01419 Encounter for gynecological examination (general) (routine) without abnormal findings: Secondary | ICD-10-CM | POA: Diagnosis not present

## 2018-01-25 DIAGNOSIS — Z6826 Body mass index (BMI) 26.0-26.9, adult: Secondary | ICD-10-CM | POA: Diagnosis not present

## 2018-02-07 MED FILL — NORETHINDRONE 0.35 MG TAB: 0.35 | 84 days supply | Qty: 84 | Fill #3

## 2018-02-10 IMAGING — US US MFM OB DETAIL+14 WK
1 series · 14 of 28 positions shown · non-contrast
Comparison: none

[Series 1: us mfm ob detail+14 wk · 78 acquisitions, 14 frames shown]
[im 3/78]
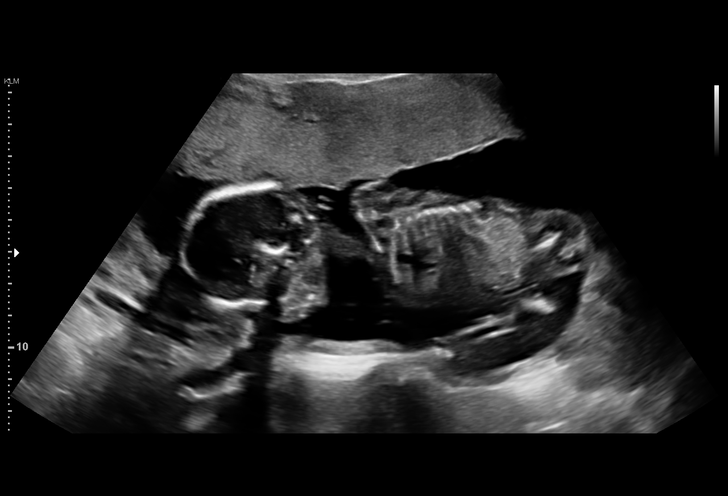
[im 9/78]
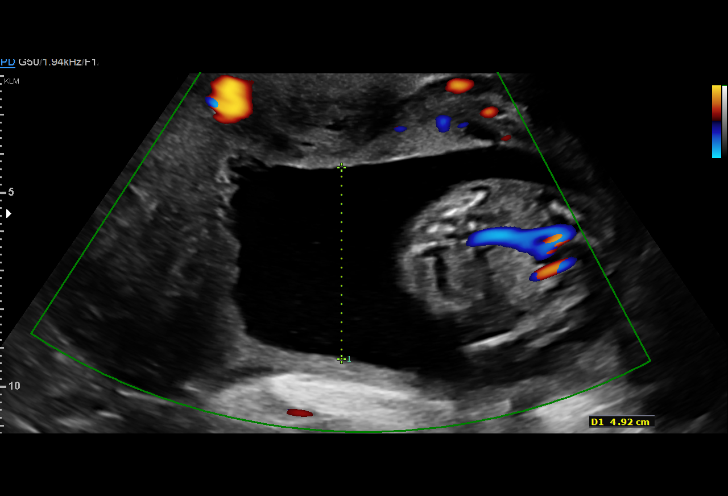
[im 15/78]
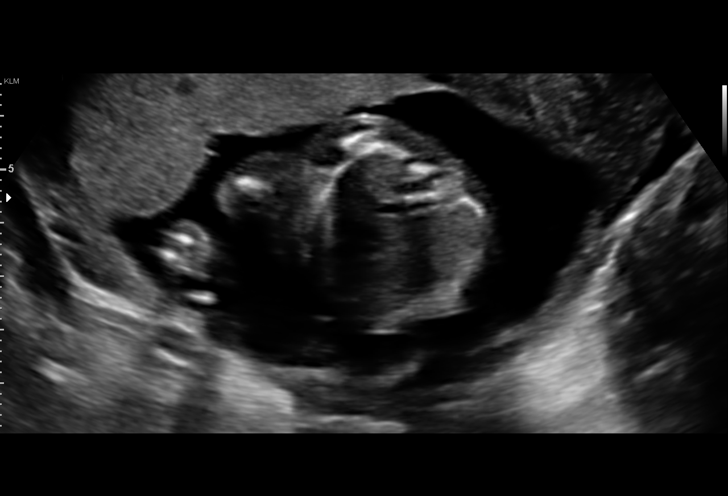
[im 20/78]
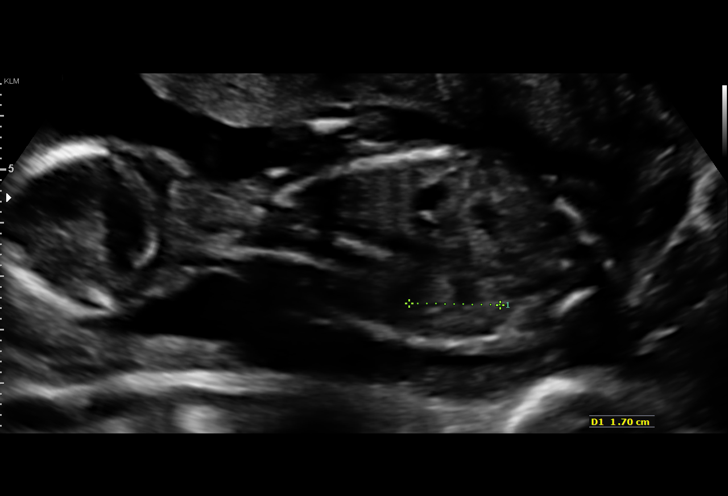
[im 26/78]
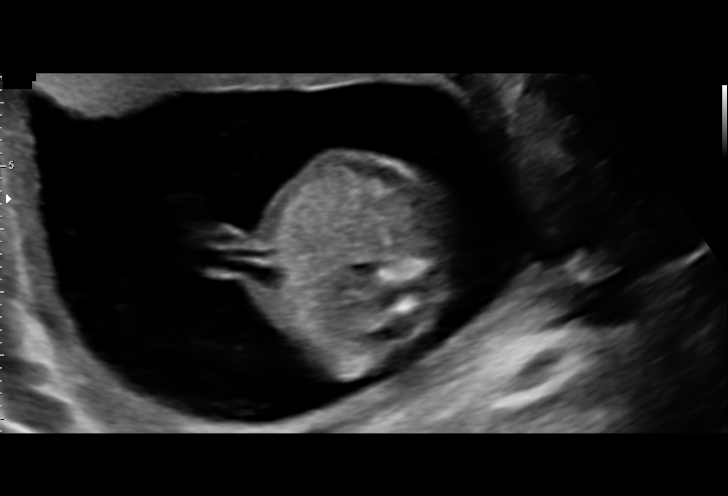
[im 32/78]
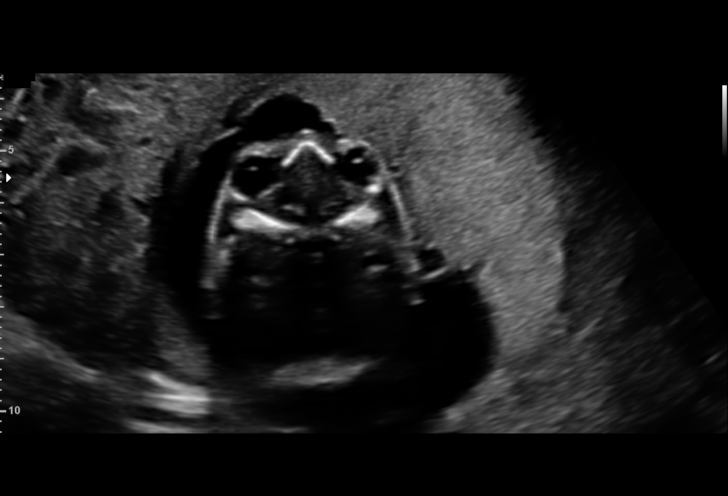
[im 38/78]
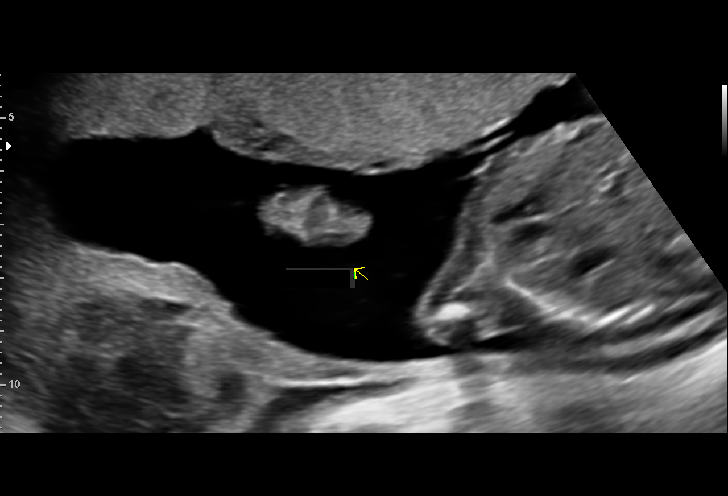
[im 43/78]
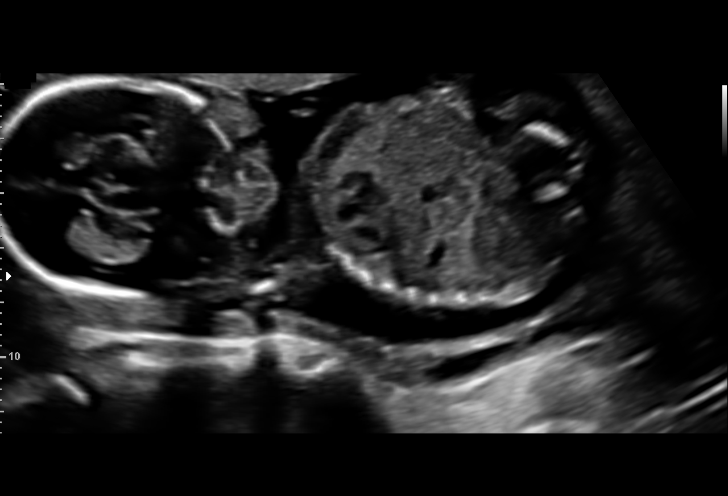
[im 49/78]
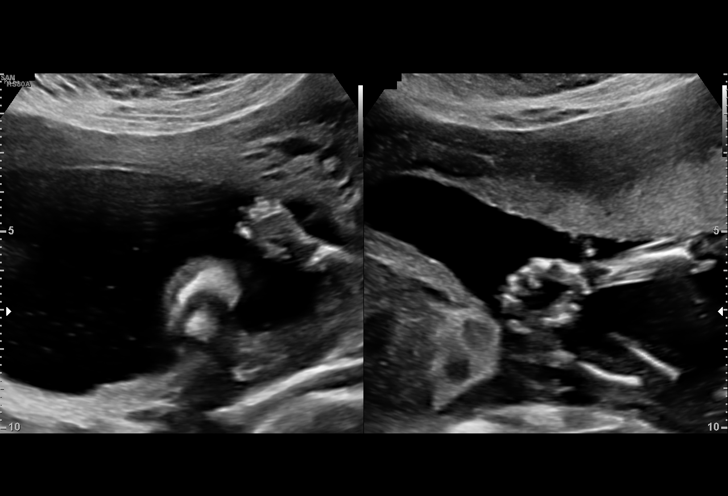
[im 55/78]
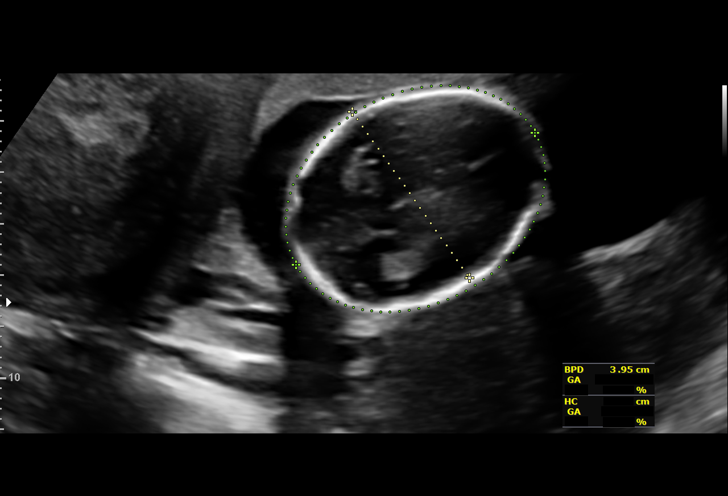
[im 60/78]
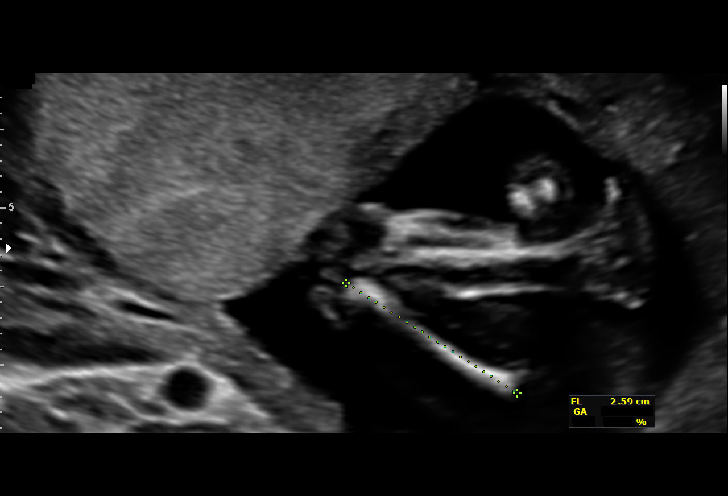
[im 66/78]
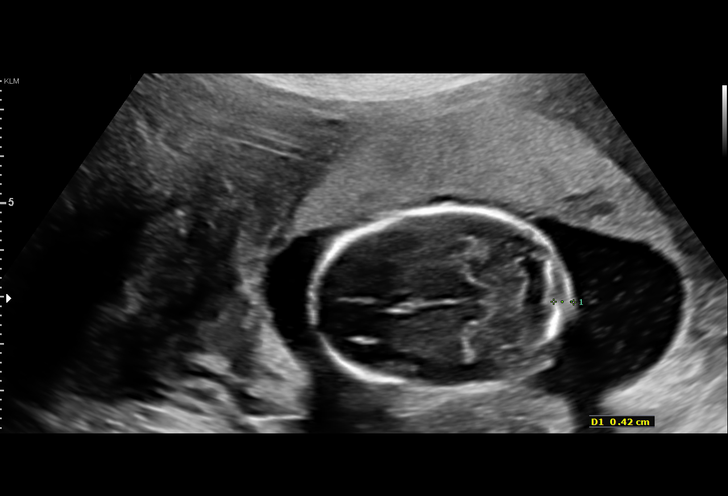
[im 72/78]
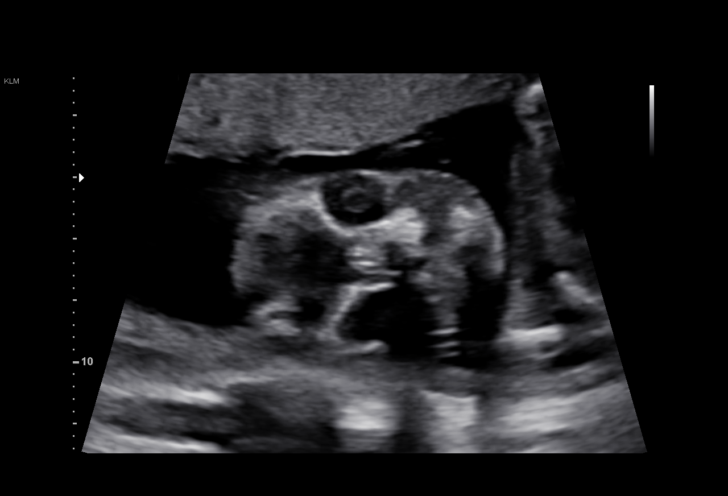
[im 78/78]
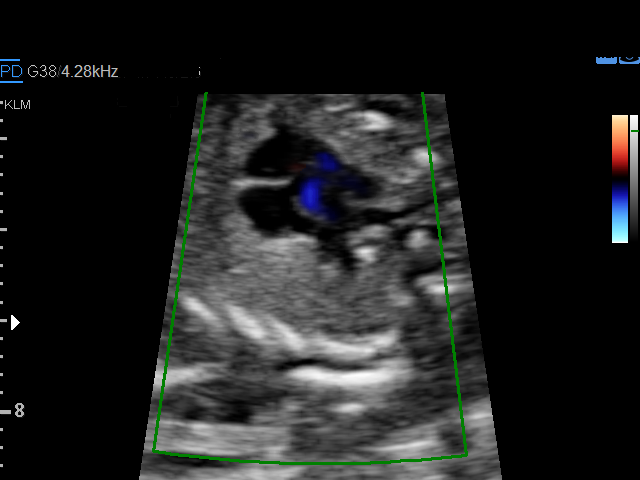

[14 of 28 positions shown; findings below may reference images not displayed]

FOREVAR

1  MUSHEG ESCH            [id number]      [id number]     [id number]
Indications

18 weeks gestation of pregnancy
Encounter for antenatal screening for
malformations
Uterine fibroids affecting pregnancy in        O34.12,
second trimester, antepartum
Advanced maternal age primigravida 35+,
second trimester; NIPS = XXY
Abnormal chromosomal and genetic finding
on antenatal screening of mother: XXY by
NIPS
OB History

Gravidity:    1
Fetal Evaluation

Num Of Fetuses:     1
Fetal Heart         142
Rate(bpm):
Cardiac Activity:   Observed
Presentation:       Breech
Placenta:           Anterior, above cervical os
P. Cord Insertion:  Visualized

Amniotic Fluid
AFI FV:      Subjectively within normal limits

Largest Pocket(cm)
4.9
Biometry

BPD:      40.1  mm     G. Age:  18w 1d         33  %    CI:        72.16   %   70 - 86
FL/HC:      17.2   %   16.1 -
HC:      150.2  mm     G. Age:  18w 1d         21  %    HC/AC:      1.16       1.09 -
AC:      129.1  mm     G. Age:  18w 4d         43  %    FL/BPD:     64.3   %
FL:       25.8  mm     G. Age:  17w 6d         20  %    FL/AC:      20.0   %   20 - 24
HUM:      25.6  mm     G. Age:  18w 0d         37  %
CER:      19.7  mm     G. Age:  18w 6d         60  %

Est. FW:     226  gm      0 lb 8 oz     37  %
Gestational Age

LMP:           18w 4d       Date:   09/13/16                 EDD:   06/20/17
U/S Today:     18w 1d                                        EDD:   06/23/17
Best:          18w 4d    Det. By:   LMP  (09/13/16)          EDD:   06/20/17
Anatomy

Cranium:               Appears normal         Aortic Arch:            Appears normal
Cavum:                 Appears normal         Ductal Arch:            Appears normal
Ventricles:            Appears normal         Diaphragm:              Appears normal
Choroid Plexus:        Appears normal         Stomach:                Appears normal, left
sided
Cerebellum:            Appears normal         Abdomen:                Appears normal
Posterior Fossa:       Appears normal         Abdominal Wall:         Appears nml (cord
insert, abd wall)
Nuchal Fold:           Appears normal         Cord Vessels:           Appears normal (3
vessel cord)
Face:                  Appears normal         Kidneys:                Appear normal
(orbits and profile)
Lips:                  Appears normal         Bladder:                Appears normal
Thoracic:              Appears normal         Spine:                  Appears normal
Heart:                 Appears normal         Upper Extremities:      Appears normal
(4CH, axis, and situs
RVOT:                  Appears normal         Lower Extremities:      Appears normal
LVOT:                  Appears normal

Other:  Fetus appears to be a male. Heels visualized. Nasal bone visualized.
Cervix Uterus Adnexa

Cervix
Length:            3.2  cm.
Normal appearance by transabdominal scan.

Uterus
Multiple fibroids noted, see table below.
Myomas

Site                     L(cm)      W(cm)      D(cm)      Location
Right Fundus             8
Left
Left Anterior
Posterior
Blood Flow                 RI        PI       Comments
Blood Flow                 RI        PI       Comments

Impression

SIUP at 18+4 weeks
Normal detailed fetal anatomy
Markers of aneuploidy: none
Normal amniotic fluid volume
Measurements consistent with LMP dating
Fibroid uterus: see above for size and location
Recommendations

Serial ultrasounds for growth
We would be happy to perform these exams.  If desired,
please call to schedule.

## 2018-02-15 DIAGNOSIS — D259 Leiomyoma of uterus, unspecified: Secondary | ICD-10-CM | POA: Diagnosis not present

## 2018-04-18 ENCOUNTER — Ambulatory Visit (INDEPENDENT_AMBULATORY_CARE_PROVIDER_SITE_OTHER): Payer: 59 | Admitting: Family Medicine

## 2018-04-18 ENCOUNTER — Encounter: Payer: Self-pay | Admitting: Family Medicine

## 2018-04-18 VITALS — BP 100/64 | HR 74 | Temp 98.0°F | Resp 14 | Ht 59.0 in | Wt 134.2 lb

## 2018-04-18 DIAGNOSIS — Z0000 Encounter for general adult medical examination without abnormal findings: Secondary | ICD-10-CM

## 2018-04-18 LAB — POCT URINALYSIS DIP (MANUAL ENTRY)
BILIRUBIN UA: NEGATIVE mg/dL
Bilirubin, UA: NEGATIVE
GLUCOSE UA: NEGATIVE mg/dL
Leukocytes, UA: NEGATIVE
Nitrite, UA: NEGATIVE
Protein Ur, POC: NEGATIVE mg/dL
SPEC GRAV UA: 1.01 (ref 1.010–1.025)
UROBILINOGEN UA: 0.2 U/dL
pH, UA: 5.5 (ref 5.0–8.0)

## 2018-04-18 LAB — POCT URINE PREGNANCY: Preg Test, Ur: NEGATIVE

## 2018-04-18 LAB — POCT GLYCOSYLATED HEMOGLOBIN (HGB A1C): Hemoglobin A1C: 5.1

## 2018-04-18 NOTE — Patient Instructions (Signed)
Thanks for establishing [facility]  LOW FAT, LOW CHOLESTEROL,3 GRAM SODIUM  This diet provides guidelines for selecting foods low in total fat, saturated fat and cholesterol without added salt  BENEFITS  Helps decrease your blood cholesterol level  Helps control your blood pressure  GUIDELINES  Reduce total fat by eating less margarine, salad dressing and oil.  Avoid fried foods, fatty meat, and whole milk items, including cheese and ice cream.  Choose foods low in saturated fat, which usually come from animal fats.  Tree plant oils, coconut, palm and palm kernel are very high in saturated fat.  Selected foods low in cholesterol.  Cholesterol is only found in foods from animal fats. Foods from plants contain no cholesterol.  Eat less salt and sodium.  Processed, cured and canned foods are usually high in salt.  Prepare food without salt or with a small amount of salt in cooking (no more than 1/4 tsp per day)  Do not add salt at the table.  Eat more vegetables fruits, breads, cereals, pasta, rice and dry beans and peas.  These foods contain little or no fat or cholesterol.  Eat more fiber.  The type of fiber in oats, barley, dry beans and peas, and many fruits and vegetables help lower blood cholesterol levels.    Food Groups Choose Go Easy On Avoid   Meats  Poultry  Fish  Dry Beans  Eggs  Nuts  Lean cuts of meat  Chicken  [place]  Fish  Egg Whites  Beans/Tofu  Shellfish  Duck  Egg yolks  Nuts  Processed meat such as bacon and bologna  Hot Dogs    Milk  Yogurt  Cheese  Fat-Free or low-fat dairy products  Skim or 1% Milk  Cheese with no more than 3 grams of fat per ounce  Low fat yogurt  2% fat milk  Sour cream  Whole milk  Swiss,American Cheddar cheese  Cream cheese   Fats, oils  Corn  Olive  Canola  Sunflower oils  Avocados  Olives  Peanut oil  Butter, lard  Bacon Fat  Coconut oil  Solid shortening    Breads, cereals, pasta, rice  Whole-grain bread  Whole wheat pasta  Whole grain rice  Plain baked potato  Granola  Biscuits  Muffins  Cornbread  Croissants  Pastries  Egg Noodles  Doughnuts   Fruits  Vegetables  Fresh, frozen  Dried  Canned fruit in syrup  Coconut  Vegetables prepared in oil   Snacks  Sorbet  Low-fat yogurt  Plain popcorn  Pretzels  Fruits/Veggies  Homemade cakes, cookies and pies with unsaturated oils  Baked chips  Ice cream  Chocolate  Potato chips  Buttered popcorn     GETTING STARTED ON CHOLESTEROL CONTROL  1. Learn about cholesterol, understand how your heart works and discover how high cholesterol can cause heart disease. 2. Use this information to understand your body and talk to your doctor about your lab report numbers, your risk factors and your lifestyle. 3. Take action.  Lifestyle changes such as exercising, eating healthier foods, and losing weight may help improve your cholesterol levels.  A SILENT PROBLEM  High cholesterol usually has no direct symptoms.  When your cholesterol is higher  Than it should be, you may not feel different, but cholesterol may still do damage to blood vessels.  As blood flows through the vessels, it carries many of the important things the body needs to function, such as oxygen an cholesterol.  So,  problems with blood vessels can lead to heart disease or stroke.  STEP 1: UNDERSTAND CHOLESTEROL   Cholesterol is a soft, fat-like, waxy substance in your bloodstream and in all your  body's cells.  It's normal to have cholesterol. It's an important part of a healthy  body.  But too high a level of cholesterol in the blood is a major risk factor for  heart disease, which can lead to a heart attack.  High cholesterol is also a major r risk factor for stroke.    Cholesterol in the blood comes from 2 sources. One is food.  Cholesterol is also  produced naturally in your body based on your  family history.   Most of the cholesterol in your blood is made by your own body.  In fact, only  about 25% of blood cholesterol comes from the food you eat.  Examples of foods  that add to your cholesterol are meats, poultry, fish, eggs, butter, cheese and  whole milk.  THE KINDS OF FATS IN YOUR BODY  1. LDL-BAD CHOLESTEROL: LDL is often called "bad"cholesterol"  That is because if there's too much, it can build up in the body.  LDL forms a thick, hard substances that can clog your blood vessels and can block the flow of blood to your heart and brain. 2. HDL-GOOD CHOLESTEROL-HDL is often called "good cholesterol".  It helps your body get rid of bad cholesterol.  HDL collects excess cholesterol that LDL has left behind in the body. 3. TRIGLYCERIDES-Triglycerides are a very special form of fat.  Levels of triglycerides in blood can sometimes be too high.  Triglycerides travel in the bloodstream to be used for energy stored as body fat.   THE 2 SOURCES OF CHOLESTEROL IN YOUR BODY  The body takes cholesterol and triglycerides from food and allows them to enter the blood.  At the same time, the liver and other body cells produce cholesterol. If this process works well, arteries remain healthy.  If not, the cholesterol builds up.  This can lead to artery damage and heart disease.  HEALTHY LEVELS  A diet low in calories and fat may help you have healthy cholesterol levels.  When levels are healthy, cholesterol gets to whre it's needed in the body without building up inside the arteries.  UNHEALTHY LEVELS  Unhealthy levels of cholesterol may be caused  By poor eating habits.  Or the liver and body cells may make too much-a problem that often runs in families.  If LDL levels are high and HDL levels are low, excess cholesterol builds up.  This damages and clogs the arteries.  STEP 2: KNOW YOUR NUMBERS AND UNDERSTAND YOUR RISK  If your cholesterol level is too high, you could be heading for a heart attack  or stroke without knowing it.  In general, the higher your cholesterol level and the more risk factors you have, the higher your risk of developing heart disease or having a heart attack.   High LDL cholesterol is one risk factor for heart disease.  Other risk factors, such as diabetes, also puts people at a higher risk.  Use this personal worksheet to learn about your risk factors so you can learn more about your risk of developing heart disease or stroke.  YOUR PERSONAL WORKSHEET 1. Have your been diagnosed with heart disease? 2. Have you had a stroke? 3. Have you ever had a blockage in your legs arteries? 4. Do you have diabetes? 5. Do you have  high blood pressure (140/90 or higher) or are  You on blood pressure medication? 6. Do you have a family history of early heart disease (heart disease  In father or brother before age 29; heart disease in mother or sister before age 99y)? 7. Do you smoke cigarettes? 8. Are you a man age 89 or older or a woman age 39 or older? 9. Do you have low HDL (good) cholesterol (less than 40 mg/dL)?  *the more "yes" answers you checked, the greater your risk of developing heart disease.   UNDERSTAND YOUR RESULTS  After you finish the worksheet, talk to your doctor about what your LDL goal should be.  The answers you provide will help your doctor assess your risk of developing heart disease or having a heart attack.  WHY DO I HAVE HIGH CHOLESTEROL, AND WHAT CAN HAPPEN TO ME?  While some of your cholesterol is caused by your diet, your family history plays a larger role than many people think.  Like many people, you may not know your body produces cholesterol naturally, based on family history.  Having high LDL (bad) cholesterol levels can put you at risk of heart disease, heart attack or stroke.  This is especially true if you have any additional risk factors, such as those listed on the worksheet.  HOW YOUR HEART IS THREATENED  Over time, unhealthy cholesterol  levels can narrow or block arteries, limiting blood flow. Plaque, a fatty material made mainly of cholesterol, can form in artery walls.  When blocked by plaque, the body parts that are not receiving enough blood can become damaged.  This can be especially dangerour to the brain and heart.  HOW PLAQUE CAUSED TROUBLE  Plaque buildup narrows the space inside the artery.  When this happens, the artery cannot carry as much blood.  Plaque makes arteries stiff.  Stiff arteries cannot stretch to allow increase blood flow when it's needed.  For example, when exercising.  Plaque can break open, causing blood clots to form on the surface of the plaque.  This can cut off blood flow.  Small pieces of blood clot can break off from plaque and block smaller arteries.  When blood flow is blocked, it can affect not only your heart, but also other parts of your body, including your brain.  This model shows some of the most common and serious effects of artery damage and blockage.   STEP 3 TAKE ACTION TO CONTROL YOUR CHOLESTEROL  What can I do?  Although there is no cure for high cholesterol, it can be controlled.  Lifestyle changes and medicine, if needed, can help restore your body's cholesterol balance and also can help control other risk factors.  MAKE A PLAN!  Along with your doctor, you can work out a plan to control your cholesterol.  Your plan will depend on your risk factors and test results.  It should include some or all of the following:  Make changes to your diet.  Certain changes in the types of food you eat can help lower LDL, control high blood pressure and control diabetes.  Exercise.  Exercise can help raise HDL and help control high blood pressure and diabetes.  Choose activities you enjoy and stick with them.  Make exercise a regular part of your life.  Control your weight.  If you are overweight, aim to lose weight and maintain your weight loss.  Stop Smoking.  Being smoke-free can  improve your cholesterol and blood pressure.  Another good  reason to quit is that smoking also damages your lungs, eyes, and skin not to mention the health of your family members.  Be more active.  Activity is not just exercise that makes you sweat.  It's also things like gardening or playing with your kids/grandkids.  Get in the habit of being more active throughout the day.  Cut down on TV time.  When doing errands, walk an extra few blocks instead of driving and park at each stop. Ask friends or family members to join in.  Talk to your doctor about starting some of the exercise tips listed below and stick to them.  Make sure you talk to your doctor  Before starting any exercise program.  EXERCISE TIPS  AT HOME:  Housework cleaning taking out the trash  Free Soil work: mow [employer], rake the leaves, work in the garden  If you have exercise equipment, use it! Pedal a stationary bike while watching TV Longer walks with the dog

## 2018-04-18 NOTE — Progress Notes (Signed)
Chief Complaint  Patient presents with  . Establish Care     Subjective:    Patient ID: Ana Davis, female    DOB: 03-31-1981, 37 y.o.   MRN: [id number]  HPI Ana Davis, a 37 year old female with a history of asthma and anxiety presents to establish care and for annual CPE. Patient presents for an annual physical examination. She states that they have not been following a routine exercise regimen. Body mass index is 27.11 kg/m. She mostly follows a balanced diet and is very active as an Therapist, sports in the emergency department. Ana Davis has a 63 month old son delivered via C-section. She is up to date with pap smear and menses sporadic due to previous breastfeeding. Patient is currently up to date with vaccinations. She typically goes to dentist twice yearly. Ana Davis is also up to date with annual eye exam. Last complete physical was greater than 1 year ago.    Past Medical History:  Diagnosis Date  . Anxiety    claustrophobic  . Asthma    childhood  . Breech presentation 06/13/2017  . Eczema   . Fibroid   . GDM, class A1 06/13/2017  . Genital warts 2013   tx with aldara generic  . Gestational diabetes   . IBS (irritable bowel syndrome)   . Vaginal Pap smear, abnormal    Social History   Socioeconomic History  . Marital status: Married    Spouse name: Not on file  . Number of children: Not on file  . Years of education: Not on file  . Highest education level: Not on file  Occupational History  . Not on file  Social Needs  . Financial resource strain: Not on file  . Food insecurity:    Worry: Not on file    Inability: Not on file  . Transportation needs:    Medical: Not on file    Non-medical: Not on file  Tobacco Use  . Smoking status: Never Smoker  . Smokeless tobacco: Never Used  Substance and Sexual Activity  . Alcohol use: Yes    Comment: social  . Drug use: No  . Sexual activity: Yes    Partners: Male    Birth control/protection: None   Lifestyle  . Physical activity:    Days per week: Not on file    Minutes per session: Not on file  . Stress: Not on file  Relationships  . Social connections:    Talks on phone: Not on file    Gets together: Not on file    Attends religious service: Not on file    Active member of club or organization: Not on file    Attends meetings of clubs or organizations: Not on file    Relationship status: Not on file  . Intimate partner violence:    Fear of current or ex partner: Not on file    Emotionally abused: Not on file    Physically abused: Not on file    Forced sexual activity: Not on file  Other Topics Concern  . Not on file  Social History Narrative  . Not on file   Immunization History  Administered Date(s) Administered  . Hepatitis A 02/16/2012  . Influenza Split 09/18/2014  . Influenza-Unspecified 09/29/2016  . Typhoid Inactivated 02/16/2012  . Yellow Fever 02/16/2012    Review of Systems  Constitutional: Negative.   HENT: Negative.   Eyes: Negative.   Respiratory: Negative.   Cardiovascular: Negative.   Gastrointestinal: Negative.  Negative for nausea, rectal pain and vomiting.  Endocrine: Negative.   Genitourinary: Negative.   Musculoskeletal: Negative.   Skin: Negative.   Allergic/Immunologic: Negative.   Neurological: Negative.   Hematological: Negative.   Psychiatric/Behavioral: Negative.  Negative for agitation, behavioral problems, confusion, decreased concentration, dysphoric mood, self-injury, sleep disturbance and suicidal ideas. The patient is not nervous/anxious.        Objective:   Physical Exam  Constitutional: She is oriented to person, place, and time. She appears well-developed and well-nourished.  HENT:  Head: Normocephalic and atraumatic.  Right Ear: External ear normal.  Left Ear: External ear normal.  Nose: Nose normal.  Mouth/Throat: Oropharynx is clear and moist.  Cardiovascular: Normal rate, regular rhythm, normal heart sounds  and intact distal pulses.  Pulses:      Carotid pulses are 2+ on the right side, and 2+ on the left side.      Radial pulses are 2+ on the right side, and 2+ on the left side.       Femoral pulses are 2+ on the right side, and 2+ on the left side.      Popliteal pulses are 2+ on the right side, and 2+ on the left side.       Dorsalis pedis pulses are 2+ on the right side, and 2+ on the left side.       Posterior tibial pulses are 2+ on the right side, and 2+ on the left side.  Pulmonary/Chest: Effort normal and breath sounds normal.  Abdominal: Soft. Bowel sounds are normal.  Musculoskeletal: Normal range of motion.  Neurological: She is alert and oriented to person, place, and time. She has normal reflexes.  Reflex Scores:      Tricep reflexes are 2+ on the right side and 2+ on the left side.      Bicep reflexes are 2+ on the right side and 2+ on the left side.      Brachioradialis reflexes are 2+ on the right side and 2+ on the left side.      Patellar reflexes are 2+ on the right side and 2+ on the left side.      Achilles reflexes are 2+ on the right side and 2+ on the left side. Skin: Skin is warm and dry.  Psychiatric: She has a normal mood and affect. Her behavior is normal. Judgment and thought content normal.     BP 100/64 (BP Location: Right Arm, Patient Position: Sitting, Cuff Size: Small)   Pulse 74   Temp 98 F (36.7 C) (Oral)   Resp 14   Ht 4\' 11"  (1.499 m)   Wt 134 lb 3.2 oz (60.9 kg)   LMP 02/15/2018   SpO2 98%   BMI 27.11 kg/m  Assessment & Plan:   Annual physical exam Recommend monthly self-breast exam.  Follow up for pap smear yearly due to history of genital warts Recommend sunscreen when spending time outdoors Recommend a balanced diet throughout the day Also, recommend 6-8 glasses of water per day.  - POCT urinalysis dipstick - POCT urine pregnancy - Hemoglobin A1c - CBC with Differential - Comprehensive metabolic panel - Vitamin D, 25-hydroxy  RTC:  1 year for annual physical    Ana Pounds  MSN, FNP-C Patient Ana Davis [address] [place], [place] [address] [phone]

## 2018-04-19 ENCOUNTER — Telehealth: Payer: Self-pay

## 2018-04-19 LAB — CBC WITH DIFFERENTIAL/PLATELET
BASOS ABS: 0 10*3/uL (ref 0.0–0.2)
Basos: 0 %
EOS (ABSOLUTE): 0.2 10*3/uL (ref 0.0–0.4)
Eos: 3 %
Hematocrit: 42.2 % (ref 34.0–46.6)
Hemoglobin: 13.5 g/dL (ref 11.1–15.9)
Immature Grans (Abs): 0 10*3/uL (ref 0.0–0.1)
Immature Granulocytes: 0 %
LYMPHS ABS: 2 10*3/uL (ref 0.7–3.1)
Lymphs: 35 %
MCH: 31 pg (ref 26.6–33.0)
MCHC: 32 g/dL (ref 31.5–35.7)
MCV: 97 fL (ref 79–97)
MONOS ABS: 0.5 10*3/uL (ref 0.1–0.9)
Monocytes: 8 %
NEUTROS ABS: 3 10*3/uL (ref 1.4–7.0)
Neutrophils: 54 %
PLATELETS: 356 10*3/uL (ref 150–379)
RBC: 4.35 x10E6/uL (ref 3.77–5.28)
RDW: 13.7 % (ref 12.3–15.4)
WBC: 5.6 10*3/uL (ref 3.4–10.8)

## 2018-04-19 LAB — COMPREHENSIVE METABOLIC PANEL
A/G RATIO: 1.3 (ref 1.2–2.2)
ALK PHOS: 111 IU/L (ref 39–117)
ALT: 17 IU/L (ref 0–32)
AST: 16 IU/L (ref 0–40)
Albumin: 4.2 g/dL (ref 3.5–5.5)
BILIRUBIN TOTAL: 0.2 mg/dL (ref 0.0–1.2)
BUN / CREAT RATIO: 20 (ref 9–23)
BUN: 15 mg/dL (ref 6–20)
CHLORIDE: 99 mmol/L (ref 96–106)
CO2: 25 mmol/L (ref 20–29)
Calcium: 9.3 mg/dL (ref 8.7–10.2)
Creatinine, Ser: 0.74 mg/dL (ref 0.57–1.00)
GFR calc Af Amer: 120 mL/min/{1.73_m2} (ref >59)
GFR calc non Af Amer: 104 mL/min/{1.73_m2} (ref >59)
GLUCOSE: 76 mg/dL (ref 65–99)
Globulin, Total: 3.3 g/dL (ref 1.5–4.5)
POTASSIUM: 4 mmol/L (ref 3.5–5.2)
Sodium: 139 mmol/L (ref 134–144)
Total Protein: 7.5 g/dL (ref 6.0–8.5)

## 2018-04-19 LAB — VITAMIN D 25 HYDROXY (VIT D DEFICIENCY, FRACTURES): VIT D 25 HYDROXY: 29.8 ng/mL — AB (ref 30.0–100.0)

## 2018-04-19 LAB — TSH: TSH: 1.24 u[IU]/mL (ref 0.450–4.500)

## 2018-04-19 NOTE — Telephone Encounter (Signed)
Called and spoke with patient, advised that vitamin D level is  Low and that we recommend taking otc vitamin D once daily 2000 units. Advised to keep next scheduled appointment.

## 2018-04-19 NOTE — Telephone Encounter (Signed)
-----   Message from Dorena Dew, [place] sent at 04/19/2018  1:01 PM EDT ----- Regarding: lab results Please inform patient that vitamin D level is low. Recommend OTC vitamin D supplement 2000 IU daily. Follow in office as scheduled.   [name]  MSN, FNP-C Patient [facility] [address] [place], [place] [address] [phone]

## 2018-04-25 ENCOUNTER — Other Ambulatory Visit: Payer: 59

## 2018-04-25 DIAGNOSIS — Z0000 Encounter for general adult medical examination without abnormal findings: Secondary | ICD-10-CM | POA: Diagnosis not present

## 2018-04-25 MED FILL — NORETHINDRONE 0.35 MG TAB: 0.35 | 84 days supply | Qty: 84 | Fill #4

## 2018-04-26 ENCOUNTER — Other Ambulatory Visit: Payer: 59

## 2018-04-26 ENCOUNTER — Telehealth: Payer: Self-pay

## 2018-04-26 LAB — LIPID PANEL
CHOLESTEROL TOTAL: 124 mg/dL (ref 100–199)
Chol/HDL Ratio: 3 ratio (ref 0.0–4.4)
HDL: 41 mg/dL (ref >39)
LDL Calculated: 75 mg/dL (ref 0–99)
TRIGLYCERIDES: 40 mg/dL (ref 0–149)
VLDL CHOLESTEROL CAL: 8 mg/dL (ref 5–40)

## 2018-04-26 NOTE — Telephone Encounter (Signed)
Called and spoke with patient, advised that cholesterol is within a normal range and to eat a balanced diet divided over small meals throughout the day and exercise about 3 to 4 days a week. Thanks!

## 2018-04-26 NOTE — Telephone Encounter (Signed)
-----   Message from Dorena Dew, [place] sent at 04/26/2018  6:13 AM EDT ----- Regarding: lab results Please inform patient that cholesterol is within a normal range. Recommend a balanced diet divided over small meals throughout the day and exercise about 3-4 days per week.   [name]  MSN, FNP-C Patient [facility] [address] [place], [place] [address] [phone]

## 2018-05-23 IMAGING — US US MFM OB FOLLOW-UP
1 series · 13 of 28 positions shown · non-contrast
Comparison: none

[Series 1: us mfm ob follow-up · 13 of 76 slices shown]
[im 3/76]
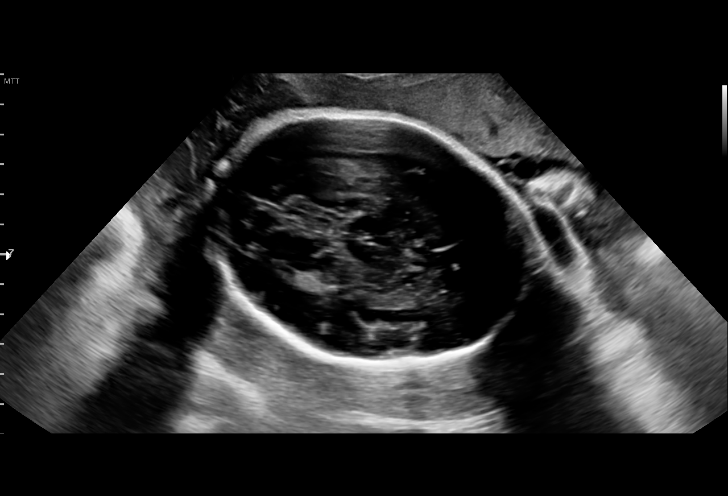
[im 9/76]
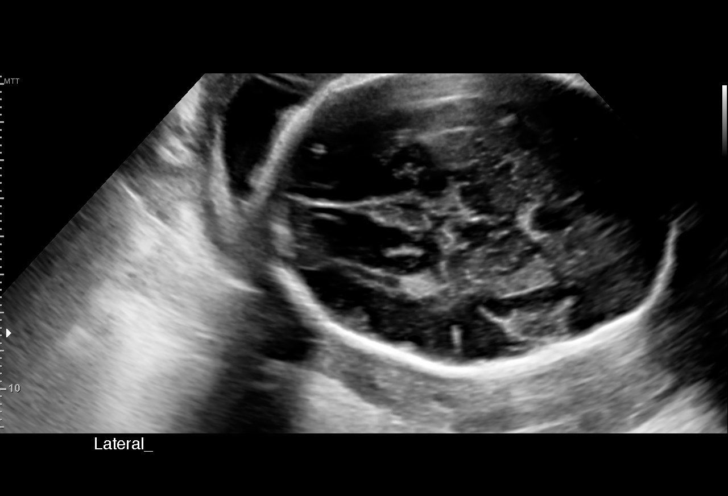
[im 14/76]
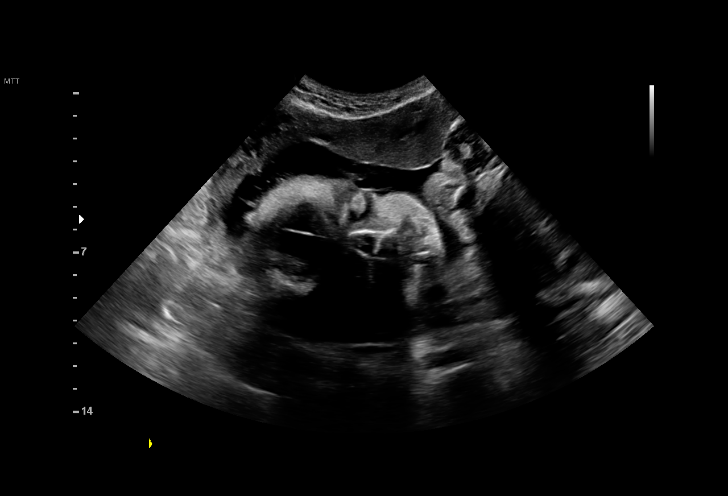
[im 20/76]
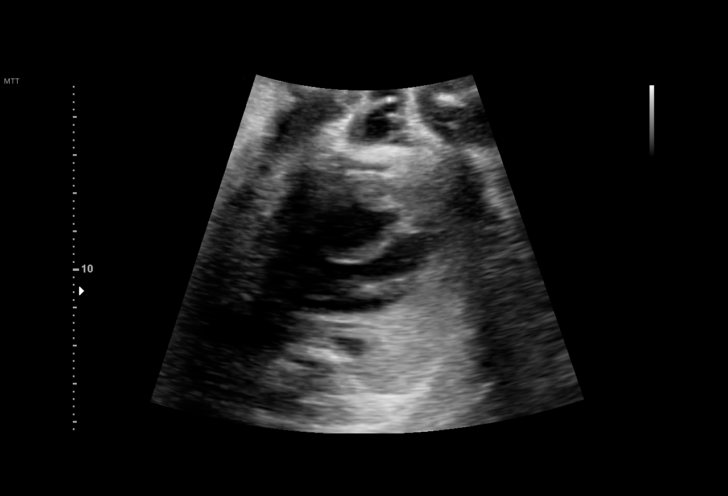
[im 26/76]
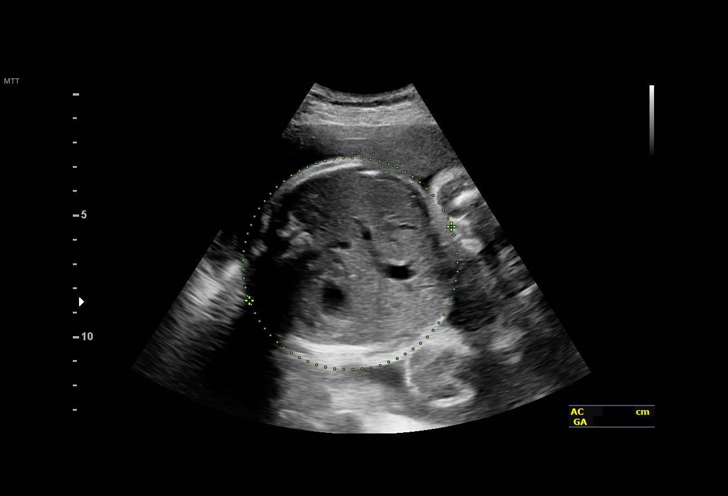
[im 31/76]
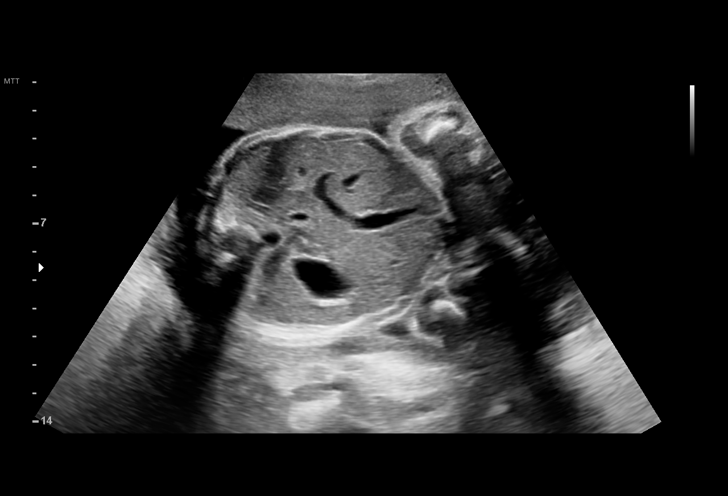
[im 39/76]
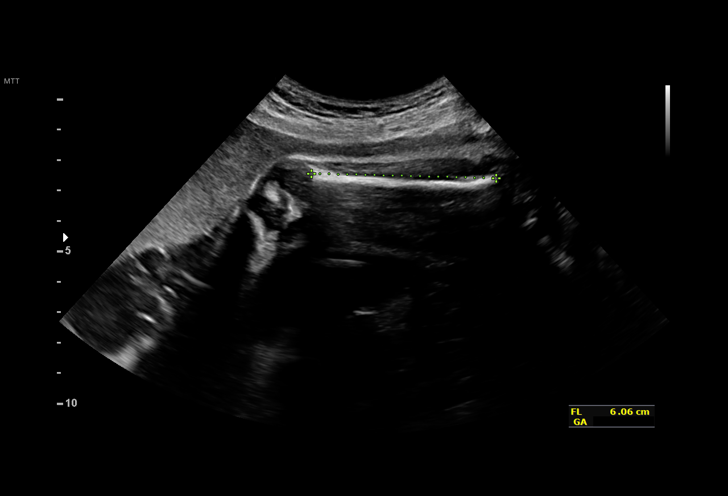
[im 45/76]
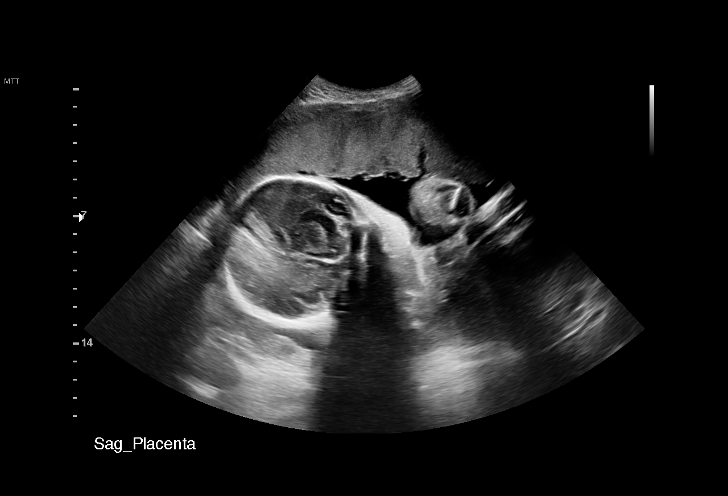
[im 51/76]
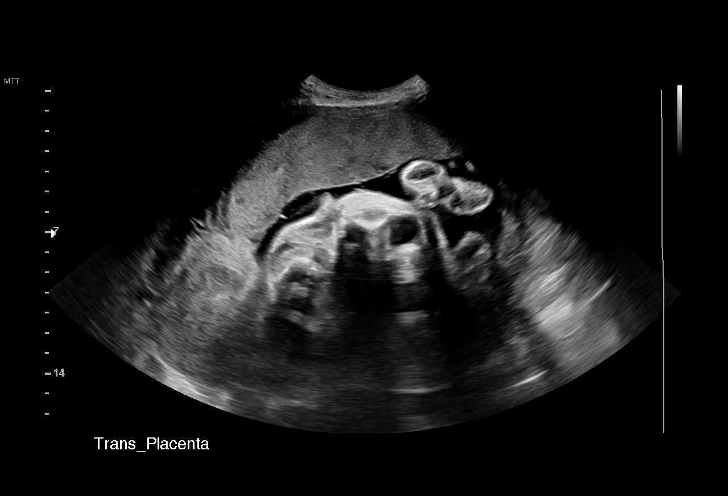
[im 56/76]
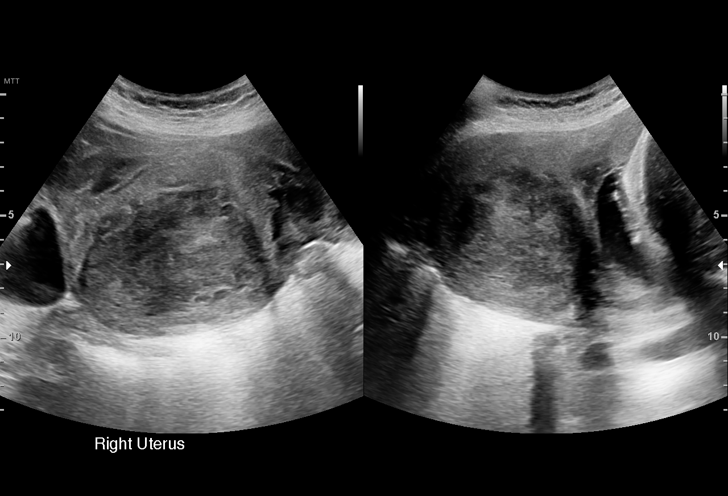
[im 62/76]
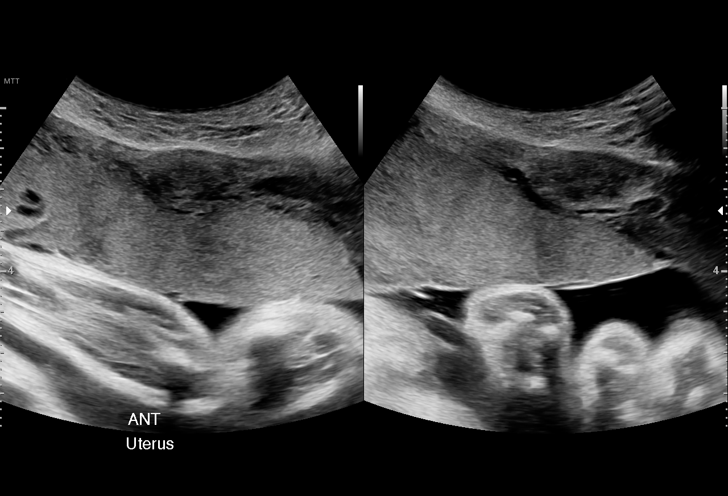
[im 67/76]
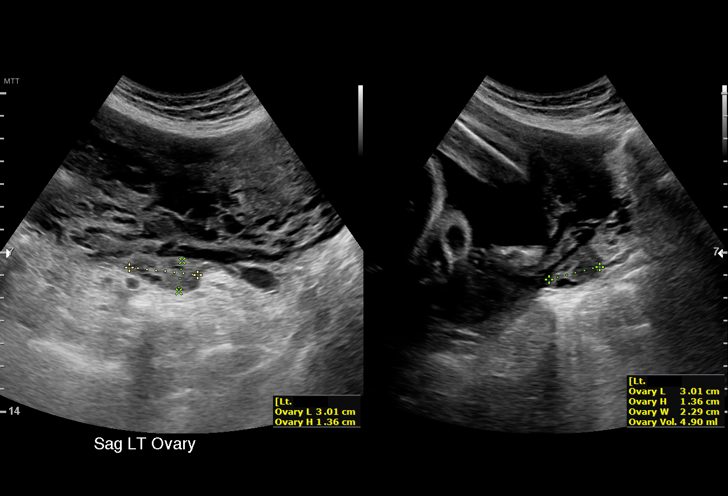
[im 73/76]
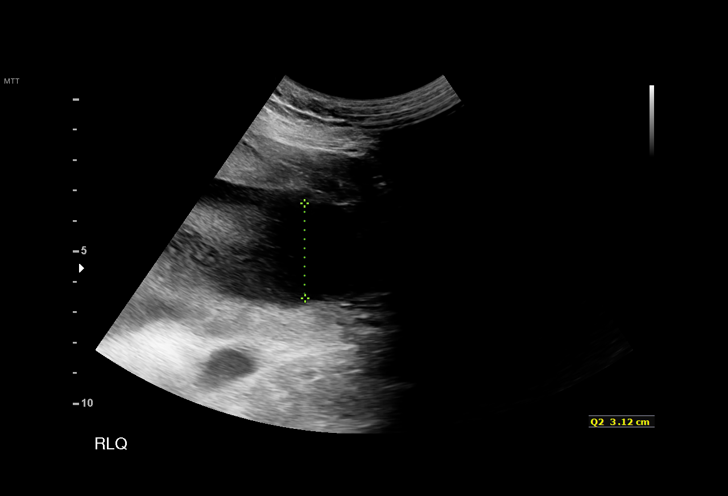

[13 of 28 positions shown; findings below may reference images not displayed]

ALONDRA

1  MARIA-SYLVINA AL-KADHI            [id number]      [phone]     [phone]
Indications

33 weeks gestation of pregnancy
Abnormal chromosomal and genetic finding
on antenatal screening of mother (XXY by
NIPS)
Small for gestational age fetus affecting
management of mother (Small AC)
Advanced maternal age primigravida 35+,
third trimester
Uterine fibroids affecting pregnancy in third  O34.13,
trimester, antepartum
Gestational diabetes in pregnancy,
unspecified control
OB History

Gravidity:    1
Fetal Evaluation

Num Of Fetuses:     1
Fetal Heart         122
Rate(bpm):
Cardiac Activity:   Observed
Presentation:       Breech
Placenta:           Anterior, above cervical os
P. Cord Insertion:  Previously Visualized

Amniotic Fluid
AFI FV:      Subjectively within normal limits

AFI Sum(cm)     %Tile       Largest Pocket(cm)
11.57           30
RUQ(cm)       RLQ(cm)       LUQ(cm)        LLQ(cm)
2.09
Biometry

BPD:      81.3  mm     G. Age:  32w 5d         30  %    CI:        73.84   %   70 - 86
FL/HC:      19.9   %   19.9 -
HC:      300.5  mm     G. Age:  33w 2d         20  %    HC/AC:      1.09       0.96 -
AC:      276.8  mm     G. Age:  31w 5d         16  %    FL/BPD:     73.7   %   71 - 87
FL:       59.9  mm     G. Age:  31w 1d          5  %    FL/AC:      21.6   %   20 - 24
HUM:      53.5  mm     G. Age:  31w 1d         19  %
CER:      43.5  mm     G. Age:  37w 4d       > 95  %
Est. FW:    4144  gm      4 lb 1 oz     33  %
Gestational Age

LMP:           33w 1d       Date:   09/13/16                 EDD:   06/20/17
U/S Today:     32w 2d                                        EDD:   06/26/17
Best:          33w 1d    Det. By:   LMP  (09/13/16)          EDD:   06/20/17
Anatomy

Cranium:               Appears normal         Aortic Arch:            Previously seen
Cavum:                 Previously seen        Ductal Arch:            Previously seen
Ventricles:            Appears normal         Diaphragm:              Appears normal
Choroid Plexus:        Previously seen        Stomach:                Appears normal, left
sided
Cerebellum:            Appears normal         Abdomen:                Previously seen
Posterior Fossa:       Previously seen        Abdominal Wall:         Previously seen
Nuchal Fold:           Previously seen        Cord Vessels:           Previously seen
Face:                  Orbits and profile     Kidneys:                Appear normal
previously seen
Lips:                  Previously seen        Bladder:                Appears normal
Thoracic:              Previously seen        Spine:                  Previously seen
Heart:                 Previously seen        Upper Extremities:      Previously seen
RVOT:                  Previously seen        Lower Extremities:      Previously seen
LVOT:                  Appears normal

Other:  Male gender previously seen. Heels previously seen. Nasal bone
previously seen.  Technically difficult due to advanced GA and fetal
position.
Cervix Uterus Adnexa

Cervix
Not visualized (advanced GA >28wks)

Uterus
Multiple fibroids noted, see table below.

Left Ovary
Size(cm)     3.01  x    2.29   x  1.36      Vol(ml):
Within normal limits. No adnexal mass visualized.

Right Ovary
Size(cm)       2.4 x     1     x  1.7       Vol(ml):
Within normal limits. No adnexal mass visualized.

Cul De Sac:   No free fluid seen.

Adnexa:       No abnormality visualized.
Myomas

Site                     L(cm)      W(cm)      D(cm)      Location
Right                    9.2        6.7        7.3        Intramural
Left                     6.5        3.7        3.8        Intramural
Left                     3.7        3.7        3.9        Intramural
Anterior                 2.4        2.6        1.5        Intramural
Anterior                 2.2        2.1        1.4        Intramural

Blood Flow                 RI        PI       Comments

Impression

Single IUP at 33w 1d
XXY by NIPT, concern over lagging fetal growth
Normal interval anatomy
Multiple uterine myomas noted as described above
Fetal growth is appropriate (33rd %tile).  The AC measures at
the 16th %tile by today's study
Anterior placenta without previa
Normal amniotic fluid volume
Recommendations

Recommend ultrasound for interval growth in 3 weeks -
please contact our office if you would prefer that this study be
scheduled with MFM.
Would check cord blood for kayotype at time of delivery.

## 2018-07-27 MED FILL — NORETHINDRONE 0.35 MG TAB: 0.35 | 84 days supply | Qty: 84 | Fill #0

## 2018-10-19 MED FILL — NORETHINDRONE 0.35 MG TAB: 0.35 | 84 days supply | Qty: 84 | Fill #1

## 2019-01-12 MED FILL — NORETHINDRONE 0.35 MG TAB: 0.35 | 84 days supply | Qty: 84 | Fill #2

## 2019-02-01 DIAGNOSIS — Z01411 Encounter for gynecological examination (general) (routine) with abnormal findings: Secondary | ICD-10-CM | POA: Diagnosis not present

## 2019-02-01 DIAGNOSIS — Z6826 Body mass index (BMI) 26.0-26.9, adult: Secondary | ICD-10-CM | POA: Diagnosis not present

## 2019-02-01 DIAGNOSIS — D259 Leiomyoma of uterus, unspecified: Secondary | ICD-10-CM | POA: Diagnosis not present

## 2019-02-22 ENCOUNTER — Encounter: Payer: Self-pay | Admitting: Family Medicine

## 2019-02-22 ENCOUNTER — Ambulatory Visit (INDEPENDENT_AMBULATORY_CARE_PROVIDER_SITE_OTHER): Payer: 59 | Admitting: Family Medicine

## 2019-02-22 ENCOUNTER — Other Ambulatory Visit: Payer: Self-pay

## 2019-02-22 VITALS — BP 123/68 | HR 61 | Temp 98.1°F | Resp 14 | Ht 59.0 in | Wt 135.0 lb

## 2019-02-22 DIAGNOSIS — Z0000 Encounter for general adult medical examination without abnormal findings: Secondary | ICD-10-CM

## 2019-02-22 DIAGNOSIS — E559 Vitamin D deficiency, unspecified: Secondary | ICD-10-CM | POA: Diagnosis not present

## 2019-02-22 DIAGNOSIS — D509 Iron deficiency anemia, unspecified: Secondary | ICD-10-CM

## 2019-02-22 LAB — POCT URINALYSIS DIPSTICK
Bilirubin, UA: NEGATIVE
Glucose, UA: NEGATIVE
Ketones, UA: NEGATIVE
Nitrite, UA: NEGATIVE
Protein, UA: NEGATIVE
Spec Grav, UA: 1.01 (ref 1.010–1.025)
Urobilinogen, UA: 0.2 E.U./dL
pH, UA: 6 (ref 5.0–8.0)

## 2019-02-22 NOTE — Progress Notes (Signed)
  Patient [facility]   Progress Note: General Provider: Lanae Boast, FNP  SUBJECTIVE:   Ana Davis is a 38 y.o. female who  has a past medical history of Anxiety, Asthma, Breech presentation (06/13/2017), Eczema, Fibroid, GDM, class A1 (06/13/2017), Genital warts (2013), Gestational diabetes, IBS (irritable bowel syndrome), and Vaginal Pap smear, abnormal.. Patient presents today for Annual Exam Patient presents for annual physical examination. Patient states that she is going to [place] and would like to know if she needs any vaccinations.  No problems or concerns today.  Review of Systems  Constitutional: Negative.   HENT: Negative.   Eyes: Negative.   Respiratory: Negative.   Cardiovascular: Negative.   Gastrointestinal: Negative.   Genitourinary: Negative.   Musculoskeletal: Negative.   Skin: Negative.   Neurological: Negative.   Psychiatric/Behavioral: Negative.      OBJECTIVE: BP 123/68 (BP Location: Left Arm, Patient Position: Sitting, Cuff Size: Normal)   Pulse 61   Temp 98.1 F (36.7 C) (Oral)   Resp 14   Ht 4\' 11"  (1.499 m)   Wt 135 lb (61.2 kg)   SpO2 100%   Breastfeeding Unknown   BMI 27.27 kg/m   Wt Readings from Last 3 Encounters:  02/22/19 135 lb (61.2 kg)  04/18/18 134 lb 3.2 oz (60.9 kg)  06/24/17 133 lb 0.8 oz (60.4 kg)     Physical Exam Vitals signs and nursing note reviewed.  Constitutional:      General: She is not in acute distress.    Appearance: She is well-developed.  HENT:     Head: Normocephalic and atraumatic.  Eyes:     Conjunctiva/sclera: Conjunctivae normal.     Pupils: Pupils are equal, round, and reactive to light.  Neck:     Musculoskeletal: Normal range of motion.  Cardiovascular:     Rate and Rhythm: Normal rate and regular rhythm.     Heart sounds: Normal heart sounds.  Pulmonary:     Effort: Pulmonary effort is normal. No respiratory distress.     Breath sounds: Normal  breath sounds.  Abdominal:     General: Bowel sounds are normal. There is no distension.     Palpations: Abdomen is soft.  Musculoskeletal: Normal range of motion.  Skin:    General: Skin is warm and dry.  Neurological:     General: No focal deficit present.     Mental Status: She is alert and oriented to person, place, and time.  Psychiatric:        Mood and Affect: Mood normal.        Behavior: Behavior normal.        Thought Content: Thought content normal.        Judgment: Judgment normal.     ASSESSMENT/PLAN: 1. Annual physical exam - Lipid Panel - CBC with Differential - Comprehensive metabolic panel - TSH - Vitamin D, 25-hydroxy - Hemoglobin A1c - Iron, TIBC and Ferritin Panel - Urinalysis Dipstick - EKG 12-Lead  2. Vitamin D deficiency - Vitamin D, 25-hydroxy  3. Iron deficiency anemia, unspecified iron deficiency anemia type - Iron, TIBC and Ferritin Panel Pending labs. Will adjust medications accordingly.     RTC in 1 year and prn   The patient verbalized understanding and agreed with plan of care.   Ms. Doug Sou. Nathaneil Canary, FNP-BC [facility] [address] [place], [place] [address] [phone]

## 2019-02-23 LAB — COMPREHENSIVE METABOLIC PANEL
ALT: 17 IU/L (ref 0–32)
AST: 18 IU/L (ref 0–40)
Albumin/Globulin Ratio: 1.5 (ref 1.2–2.2)
Albumin: 4.2 g/dL (ref 3.8–4.8)
Alkaline Phosphatase: 87 IU/L (ref 39–117)
BUN/Creatinine Ratio: 18 (ref 9–23)
BUN: 16 mg/dL (ref 6–20)
Bilirubin Total: 0.3 mg/dL (ref 0.0–1.2)
CO2: 20 mmol/L (ref 20–29)
Calcium: 8.8 mg/dL (ref 8.7–10.2)
Chloride: 101 mmol/L (ref 96–106)
Creatinine, Ser: 0.91 mg/dL (ref 0.57–1.00)
GFR calc Af Amer: 93 mL/min/{1.73_m2} (ref >59)
GFR calc non Af Amer: 80 mL/min/{1.73_m2} (ref >59)
Globulin, Total: 2.8 g/dL (ref 1.5–4.5)
Glucose: 86 mg/dL (ref 65–99)
Potassium: 3.6 mmol/L (ref 3.5–5.2)
Sodium: 137 mmol/L (ref 134–144)
Total Protein: 7 g/dL (ref 6.0–8.5)

## 2019-02-23 LAB — CBC WITH DIFFERENTIAL/PLATELET
Basophils Absolute: 0 10*3/uL (ref 0.0–0.2)
Basos: 0 %
EOS (ABSOLUTE): 0 10*3/uL (ref 0.0–0.4)
Eos: 0 %
Hematocrit: 35.6 % (ref 34.0–46.6)
Hemoglobin: 12 g/dL (ref 11.1–15.9)
Immature Grans (Abs): 0 10*3/uL (ref 0.0–0.1)
Immature Granulocytes: 0 %
Lymphocytes Absolute: 2.8 10*3/uL (ref 0.7–3.1)
Lymphs: 42 %
MCH: 31.6 pg (ref 26.6–33.0)
MCHC: 33.7 g/dL (ref 31.5–35.7)
MCV: 94 fL (ref 79–97)
Monocytes Absolute: 0.5 10*3/uL (ref 0.1–0.9)
Monocytes: 8 %
Neutrophils Absolute: 3.3 10*3/uL (ref 1.4–7.0)
Neutrophils: 50 %
Platelets: 263 10*3/uL (ref 150–450)
RBC: 3.8 x10E6/uL (ref 3.77–5.28)
RDW: 11.9 % (ref 11.7–15.4)
WBC: 6.7 10*3/uL (ref 3.4–10.8)

## 2019-02-23 LAB — IRON,TIBC AND FERRITIN PANEL
Ferritin: 59 ng/mL (ref 15–150)
Iron Saturation: 15 % (ref 15–55)
Iron: 42 ug/dL (ref 27–159)
Total Iron Binding Capacity: 271 ug/dL (ref 250–450)
UIBC: 229 ug/dL (ref 131–425)

## 2019-02-23 LAB — LIPID PANEL
Chol/HDL Ratio: 2.4 ratio (ref 0.0–4.4)
Cholesterol, Total: 123 mg/dL (ref 100–199)
HDL: 51 mg/dL (ref >39)
LDL Calculated: 63 mg/dL (ref 0–99)
Triglycerides: 46 mg/dL (ref 0–149)
VLDL Cholesterol Cal: 9 mg/dL (ref 5–40)

## 2019-02-23 LAB — VITAMIN D 25 HYDROXY (VIT D DEFICIENCY, FRACTURES): Vit D, 25-Hydroxy: 30.2 ng/mL (ref 30.0–100.0)

## 2019-02-23 LAB — HEMOGLOBIN A1C
Est. average glucose Bld gHb Est-mCnc: 100 mg/dL
Hgb A1c MFr Bld: 5.1 % (ref 4.8–5.6)

## 2019-02-23 LAB — TSH: TSH: 1.8 u[IU]/mL (ref 0.450–4.500)

## 2019-03-24 MED FILL — NORETHINDRONE 0.35 MG TAB: 0.35 | 84 days supply | Qty: 84 | Fill #0

## 2019-09-06 ENCOUNTER — Encounter (HOSPITAL_COMMUNITY): Payer: Self-pay

## 2019-09-06 ENCOUNTER — Encounter (HOSPITAL_COMMUNITY): Payer: Self-pay | Admitting: *Deleted

## 2020-01-31 ENCOUNTER — Other Ambulatory Visit: Payer: Self-pay

## 2020-01-31 ENCOUNTER — Encounter: Payer: Self-pay | Admitting: Nurse Practitioner

## 2020-01-31 ENCOUNTER — Ambulatory Visit (INDEPENDENT_AMBULATORY_CARE_PROVIDER_SITE_OTHER): Payer: 59 | Admitting: Nurse Practitioner

## 2020-01-31 VITALS — BP 100/52 | HR 56 | Temp 97.9°F | Resp 14 | Ht 59.0 in | Wt 138.0 lb

## 2020-01-31 DIAGNOSIS — Z0000 Encounter for general adult medical examination without abnormal findings: Secondary | ICD-10-CM

## 2020-01-31 NOTE — Progress Notes (Signed)
Established Patient Office Visit  Subjective:  Patient ID: Ana Davis, female    DOB: 1981-09-24  Age: 39 y.o. MRN: [id number]  CC:  Chief Complaint  Patient presents with  . Annual Exam    HPI TAYLORE GRINDSTAFF presents for annual exam.  History significant for anxiety, eczema, fibroids IBS and gestational diabetes.  She admits that she is doing well overall.  She has started make dietary and lifestyle changes.  She has noticed some constipation but is now using probiotics.  She is planning to have another child in the future.  Due to planning for additional children this she has declined Covid vaccine; she is an Health visitor. She is followed by GYN for Pap smear she will schedule this along with mammogram.    Past Medical History:  Diagnosis Date  . Anxiety    claustrophobic  . Asthma    childhood  . Breech presentation 06/13/2017  . Eczema   . Fibroid   . GDM, class A1 06/13/2017  . Genital warts 2013   tx with aldara generic  . Gestational diabetes   . IBS (irritable bowel syndrome)   . Vaginal Pap smear, abnormal     Past Surgical History:  Procedure Laterality Date  . CESAREAN SECTION N/A 06/14/2017   Procedure: Primary CESAREAN SECTION;  Surgeon: Azucena Fallen, MD;  Location: [facility];  Service: Obstetrics;  Laterality: N/A;  EDD: 06/20/17 Allergy: Codeine  . COLONOSCOPY  08/2015  . WISDOM TOOTH EXTRACTION      Family History  Problem Relation Age of Onset  . Cancer Maternal Grandmother        great gran- breast  . Seizures Maternal Grandmother   . Diabetes Maternal Grandfather   . Kidney disease Maternal Grandfather   . Hypertension Father   . Arthritis Paternal Grandfather     Social History   Socioeconomic History  . Marital status: Married    Spouse name: Not on file  . Number of children: Not on file  . Years of education: Not on file  . Highest education level: Not on file  Occupational History  . Not on file  Tobacco Use   . Smoking status: Never Smoker  . Smokeless tobacco: Never Used  Substance and Sexual Activity  . Alcohol use: Yes    Comment: social  . Drug use: No  . Sexual activity: Yes    Partners: Male    Birth control/protection: None  Other Topics Concern  . Not on file  Social History Narrative  . Not on file   Social Determinants of Health   Financial Resource Strain:   . Difficulty of Paying Living Expenses: Not on file  Food Insecurity:   . Worried About Charity fundraiser in the Last Year: Not on file  . Ran Out of Food in the Last Year: Not on file  Transportation Needs:   . Lack of Transportation (Medical): Not on file  . Lack of Transportation (Non-Medical): Not on file  Physical Activity:   . Days of Exercise per Week: Not on file  . Minutes of Exercise per Session: Not on file  Stress:   . Feeling of Stress : Not on file  Social Connections:   . Frequency of Communication with Friends and Family: Not on file  . Frequency of Social Gatherings with Friends and Family: Not on file  . Attends Religious Services: Not on file  . Active Member of Clubs or Organizations: Not on file  .  Attends Archivist Meetings: Not on file  . Marital Status: Not on file  Intimate Partner Violence:   . Fear of Current or Ex-Partner: Not on file  . Emotionally Abused: Not on file  . Physically Abused: Not on file  . Sexually Abused: Not on file    Outpatient Medications Prior to Visit  Medication Sig Dispense Refill  . acetaminophen (TYLENOL) 500 MG tablet Take 500-1,000 mg by mouth 2 (two) times daily as needed for mild pain.    . Ascorbic Acid (VITAMIN C) 1000 MG tablet Take 1,000 mg by mouth daily.    . cetirizine (ZYRTEC) 10 MG tablet Take 10 mg by mouth daily as needed for allergies.     . Cholecalciferol (VITAMIN D) 125 MCG (5000 UT) CAPS Take by mouth.    Marland Kitchen ibuprofen (ADVIL,MOTRIN) 600 MG tablet Take 1 tablet (600 mg total) by mouth every 6 (six) hours as needed for  mild pain. 30 tablet 0  . cyclobenzaprine (FLEXERIL) 10 MG tablet Take 1 tablet (10 mg total) by mouth every 8 (eight) hours as needed for muscle spasms. (Patient not taking: Reported on 02/22/2019) 30 tablet 0  . Fiber POWD Take by mouth daily as needed (constipation). 1 tablespoon    . norethindrone (MICRONOR,CAMILA,ERRIN) 0.35 MG tablet Take 0.35 mg by mouth daily.     No facility-administered medications prior to visit.    Allergies  Allergen Reactions  . Hydrocodone     Hallucinations    ROS Review of Systems  Constitutional:       Restarted exercises 3 days   HENT: Negative.   Eyes: Negative.   Respiratory: Negative.   Cardiovascular: Negative.   Gastrointestinal: Positive for constipation.       Hx of IBS using probiotics   Endocrine: Negative.   Genitourinary: Negative.   Musculoskeletal:       Left knee pain  Skin: Negative.   Allergic/Immunologic: Negative.   Neurological: Negative.   Hematological: Negative.   Psychiatric/Behavioral: Negative.       Objective:    Physical Exam  Constitutional: She is oriented to person, place, and time. She appears well-developed and well-nourished.  HENT:  Head: Normocephalic.  Right Ear: External ear normal.  Left Ear: External ear normal.  Eyes: Pupils are equal, round, and reactive to light.  Cardiovascular: Normal rate, regular rhythm, normal heart sounds and intact distal pulses.  Regular irregularity  Pulmonary/Chest: Effort normal and breath sounds normal.  Abdominal: Soft. Bowel sounds are normal.  Musculoskeletal:        General: Normal range of motion.     Cervical back: Normal range of motion and neck supple.  Neurological: She is alert and oriented to person, place, and time.  Skin: Skin is warm and dry.  Psychiatric: She has a normal mood and affect. Her behavior is normal. Judgment and thought content normal.    BP (!) 100/52 (BP Location: Left Arm, Patient Position: Sitting, Cuff Size: Normal)    Pulse (!) 56   Temp 97.9 F (36.6 C) (Oral)   Resp 14   Ht 4\' 11"  (1.499 m)   Wt 138 lb (62.6 kg)   LMP 01/27/2020   SpO2 100%   BMI 27.87 kg/m  Wt Readings from Last 3 Encounters:  01/31/20 138 lb (62.6 kg)  02/22/19 135 lb (61.2 kg)  04/18/18 134 lb 3.2 oz (60.9 kg)     Health Maintenance Due  Topic Date Due  . INFLUENZA VACCINE  07/29/2019  .  PAP SMEAR-Modifier  10/14/2019    There are no preventive care reminders to display for this patient.  Lab Results  Component Value Date   TSH 1.800 02/22/2019   Lab Results  Component Value Date   WBC 6.7 02/22/2019   HGB 12.0 02/22/2019   HCT 35.6 02/22/2019   MCV 94 02/22/2019   PLT 263 02/22/2019   Lab Results  Component Value Date   NA 137 02/22/2019   K 3.6 02/22/2019   CO2 20 02/22/2019   GLUCOSE 86 02/22/2019   BUN 16 02/22/2019   CREATININE 0.91 02/22/2019   BILITOT 0.3 02/22/2019   ALKPHOS 87 02/22/2019   AST 18 02/22/2019   ALT 17 02/22/2019   PROT 7.0 02/22/2019   ALBUMIN 4.2 02/22/2019   CALCIUM 8.8 02/22/2019   Lab Results  Component Value Date   CHOL 123 02/22/2019   Lab Results  Component Value Date   HDL 51 02/22/2019   Lab Results  Component Value Date   LDLCALC 63 02/22/2019   Lab Results  Component Value Date   TRIG 46 02/22/2019   Lab Results  Component Value Date   CHOLHDL 2.4 02/22/2019   Lab Results  Component Value Date   HGBA1C 5.1 02/22/2019      Assessment & Plan:   Problem List Items Addressed This Visit    None    Visit Diagnoses    Annual physical exam    -  Primary   Relevant Orders   Comprehensive metabolic panel   CBC with Differential   Lipid panel   TSH   Vitamin D, 25-hydroxy      No orders of the defined types were placed in this encounter.   Follow-up: Return in 1 year (on 01/30/2021).    Vevelyn Francois, NP

## 2020-01-31 NOTE — Patient Instructions (Signed)
Health Maintenance, Female Adopting a healthy lifestyle and getting preventive care are important in promoting health and wellness. Ask your health care provider about:  The right schedule for you to have regular tests and exams.  Things you can do on your own to prevent diseases and keep yourself healthy. What should I know about diet, weight, and exercise? Eat a healthy diet   Eat a diet that includes plenty of vegetables, fruits, low-fat dairy products, and lean protein.  Do not eat a lot of foods that are high in solid fats, added sugars, or sodium. Maintain a healthy weight Body mass index (BMI) is used to identify weight problems. It estimates body fat based on height and weight. Your health care provider can help determine your BMI and help you achieve or maintain a healthy weight. Get regular exercise Get regular exercise. This is one of the most important things you can do for your health. Most adults should:  Exercise for at least 150 minutes each week. The exercise should increase your heart rate and make you sweat (moderate-intensity exercise).  Do strengthening exercises at least twice a week. This is in addition to the moderate-intensity exercise.  Spend less time sitting. Even light physical activity can be beneficial. Watch cholesterol and blood lipids Have your blood tested for lipids and cholesterol at 40 years of age, then have this test every 5 years. Have your cholesterol levels checked more often if:  Your lipid or cholesterol levels are high.  You are older than 40 years of age.  You are at high risk for heart disease. What should I know about cancer screening? Depending on your health history and family history, you may need to have cancer screening at various ages. This may include screening for:  Breast cancer.  Cervical cancer.  Colorectal cancer.  Skin cancer.  Lung cancer. What should I know about heart disease, diabetes, and high blood  pressure? Blood pressure and heart disease  High blood pressure causes heart disease and increases the risk of stroke. This is more likely to develop in people who have high blood pressure readings, are of African descent, or are overweight.  Have your blood pressure checked: ? Every 3-5 years if you are 18-39 years of age. ? Every year if you are 40 years old or older. Diabetes Have regular diabetes screenings. This checks your fasting blood sugar level. Have the screening done:  Once every three years after age 40 if you are at a normal weight and have a low risk for diabetes.  More often and at a younger age if you are overweight or have a high risk for diabetes. What should I know about preventing infection? Hepatitis B If you have a higher risk for hepatitis B, you should be screened for this virus. Talk with your health care provider to find out if you are at risk for hepatitis B infection. Hepatitis C Testing is recommended for:  Everyone born from 1945 through 1965.  Anyone with known risk factors for hepatitis C. Sexually transmitted infections (STIs)  Get screened for STIs, including gonorrhea and chlamydia, if: ? You are sexually active and are younger than 40 years of age. ? You are older than 40 years of age and your health care provider tells you that you are at risk for this type of infection. ? Your sexual activity has changed since you were last screened, and you are at increased risk for chlamydia or gonorrhea. Ask your health care provider if   you are at risk.  Ask your health care provider about whether you are at high risk for HIV. Your health care provider may recommend a prescription medicine to help prevent HIV infection. If you choose to take medicine to prevent HIV, you should first get tested for HIV. You should then be tested every 3 months for as long as you are taking the medicine. Pregnancy  If you are about to stop having your period (premenopausal) and  you may become pregnant, seek counseling before you get pregnant.  Take 400 to 800 micrograms (mcg) of folic acid every day if you become pregnant.  Ask for birth control (contraception) if you want to prevent pregnancy. Osteoporosis and menopause Osteoporosis is a disease in which the bones lose minerals and strength with aging. This can result in bone fractures. If you are 65 years old or older, or if you are at risk for osteoporosis and fractures, ask your health care provider if you should:  Be screened for bone loss.  Take a calcium or vitamin D supplement to lower your risk of fractures.  Be given hormone replacement therapy (HRT) to treat symptoms of menopause. Follow these instructions at home: Lifestyle  Do not use any products that contain nicotine or tobacco, such as cigarettes, e-cigarettes, and chewing tobacco. If you need help quitting, ask your health care provider.  Do not use street drugs.  Do not share needles.  Ask your health care provider for help if you need support or information about quitting drugs. Alcohol use  Do not drink alcohol if: ? Your health care provider tells you not to drink. ? You are pregnant, may be pregnant, or are planning to become pregnant.  If you drink alcohol: ? Limit how much you use to 0-1 drink a day. ? Limit intake if you are breastfeeding.  Be aware of how much alcohol is in your drink. In the [place] one drink equals one 12 oz bottle of beer (355 mL), one 5 oz glass of wine (148 mL), or one 1 oz glass of hard liquor (44 mL). General instructions  Schedule regular health, dental, and eye exams.  Stay current with your vaccines.  Tell your health care provider if: ? You often feel depressed. ? You have ever been abused or do not feel safe at home. Summary  Adopting a healthy lifestyle and getting preventive care are important in promoting health and wellness.  Follow your health care provider's instructions about healthy  diet, exercising, and getting tested or screened for diseases.  Follow your health care provider's instructions on monitoring your cholesterol and blood pressure. This information is not intended to replace advice given to you by your health care provider. Make sure you discuss any questions you have with your health care provider. Document Revised: 12/07/2018 Document Reviewed: 12/07/2018 Elsevier Patient Education  2020 Elsevier Inc.  

## 2020-02-01 LAB — COMPREHENSIVE METABOLIC PANEL
ALT: 29 IU/L (ref 0–32)
AST: 21 IU/L (ref 0–40)
Albumin/Globulin Ratio: 1.6 (ref 1.2–2.2)
Albumin: 4 g/dL (ref 3.8–4.8)
Alkaline Phosphatase: 62 IU/L (ref 39–117)
BUN/Creatinine Ratio: 15 (ref 9–23)
BUN: 12 mg/dL (ref 6–20)
Bilirubin Total: 0.2 mg/dL (ref 0.0–1.2)
CO2: 25 mmol/L (ref 20–29)
Calcium: 8.7 mg/dL (ref 8.7–10.2)
Chloride: 103 mmol/L (ref 96–106)
Creatinine, Ser: 0.79 mg/dL (ref 0.57–1.00)
GFR calc Af Amer: 109 mL/min/{1.73_m2} (ref >59)
GFR calc non Af Amer: 95 mL/min/{1.73_m2} (ref >59)
Globulin, Total: 2.5 g/dL (ref 1.5–4.5)
Glucose: 91 mg/dL (ref 65–99)
Potassium: 3.8 mmol/L (ref 3.5–5.2)
Sodium: 138 mmol/L (ref 134–144)
Total Protein: 6.5 g/dL (ref 6.0–8.5)

## 2020-02-01 LAB — CBC WITH DIFFERENTIAL/PLATELET
Basophils Absolute: 0 10*3/uL (ref 0.0–0.2)
Basos: 0 %
EOS (ABSOLUTE): 0 10*3/uL (ref 0.0–0.4)
Eos: 1 %
Hematocrit: 34.9 % (ref 34.0–46.6)
Hemoglobin: 11.5 g/dL (ref 11.1–15.9)
Immature Grans (Abs): 0 10*3/uL (ref 0.0–0.1)
Immature Granulocytes: 0 %
Lymphocytes Absolute: 2 10*3/uL (ref 0.7–3.1)
Lymphs: 41 %
MCH: 31.7 pg (ref 26.6–33.0)
MCHC: 33 g/dL (ref 31.5–35.7)
MCV: 96 fL (ref 79–97)
Monocytes Absolute: 0.4 10*3/uL (ref 0.1–0.9)
Monocytes: 8 %
Neutrophils Absolute: 2.4 10*3/uL (ref 1.4–7.0)
Neutrophils: 50 %
Platelets: 281 10*3/uL (ref 150–450)
RBC: 3.63 x10E6/uL — ABNORMAL LOW (ref 3.77–5.28)
RDW: 11.7 % (ref 11.7–15.4)
WBC: 4.8 10*3/uL (ref 3.4–10.8)

## 2020-02-01 LAB — LIPID PANEL
Chol/HDL Ratio: 2.3 ratio (ref 0.0–4.4)
Cholesterol, Total: 130 mg/dL (ref 100–199)
HDL: 57 mg/dL (ref >39)
LDL Chol Calc (NIH): 63 mg/dL (ref 0–99)
Triglycerides: 42 mg/dL (ref 0–149)
VLDL Cholesterol Cal: 10 mg/dL (ref 5–40)

## 2020-02-01 LAB — TSH: TSH: 0.695 u[IU]/mL (ref 0.450–4.500)

## 2020-02-02 LAB — VITAMIN D 25 HYDROXY (VIT D DEFICIENCY, FRACTURES): Vit D, 25-Hydroxy: 40.8 ng/mL (ref 30.0–100.0)

## 2020-02-02 LAB — SPECIMEN STATUS REPORT

## 2020-02-07 DIAGNOSIS — Z01419 Encounter for gynecological examination (general) (routine) without abnormal findings: Secondary | ICD-10-CM | POA: Diagnosis not present

## 2020-02-07 DIAGNOSIS — Z6827 Body mass index (BMI) 27.0-27.9, adult: Secondary | ICD-10-CM | POA: Diagnosis not present

## 2020-02-07 DIAGNOSIS — R1900 Intra-abdominal and pelvic swelling, mass and lump, unspecified site: Secondary | ICD-10-CM | POA: Diagnosis not present

## 2020-02-20 DIAGNOSIS — D259 Leiomyoma of uterus, unspecified: Secondary | ICD-10-CM | POA: Diagnosis not present

## 2020-05-29 ENCOUNTER — Other Ambulatory Visit: Payer: Self-pay | Admitting: Obstetrics & Gynecology

## 2020-05-29 DIAGNOSIS — Z1231 Encounter for screening mammogram for malignant neoplasm of breast: Secondary | ICD-10-CM | POA: Diagnosis not present

## 2020-05-29 DIAGNOSIS — R928 Other abnormal and inconclusive findings on diagnostic imaging of breast: Secondary | ICD-10-CM

## 2020-06-07 ENCOUNTER — Other Ambulatory Visit: Payer: 59

## 2020-06-13 ENCOUNTER — Ambulatory Visit
Admission: RE | Admit: 2020-06-13 | Discharge: 2020-06-13 | Disposition: A | Discharge status: Home / Self Care | Payer: 59 | Source: Ambulatory Visit | Attending: Obstetrics & Gynecology | Admitting: Obstetrics & Gynecology

## 2020-06-13 ENCOUNTER — Other Ambulatory Visit: Payer: Self-pay

## 2020-06-13 ENCOUNTER — Other Ambulatory Visit: Payer: Self-pay | Admitting: Obstetrics & Gynecology

## 2020-06-13 DIAGNOSIS — N6310 Unspecified lump in the right breast, unspecified quadrant: Secondary | ICD-10-CM

## 2020-06-13 DIAGNOSIS — R928 Other abnormal and inconclusive findings on diagnostic imaging of breast: Secondary | ICD-10-CM

## 2020-06-13 DIAGNOSIS — N6312 Unspecified lump in the right breast, upper inner quadrant: Secondary | ICD-10-CM | POA: Diagnosis not present

## 2020-09-08 DIAGNOSIS — U071 COVID-19: Secondary | ICD-10-CM | POA: Diagnosis not present

## 2020-09-08 DIAGNOSIS — R05 Cough: Secondary | ICD-10-CM | POA: Diagnosis not present

## 2020-09-08 DIAGNOSIS — J029 Acute pharyngitis, unspecified: Secondary | ICD-10-CM | POA: Diagnosis not present

## 2020-09-11 ENCOUNTER — Other Ambulatory Visit: Payer: Self-pay | Admitting: Physician Assistant

## 2020-09-11 DIAGNOSIS — U071 COVID-19: Secondary | ICD-10-CM

## 2020-09-11 DIAGNOSIS — Z6827 Body mass index (BMI) 27.0-27.9, adult: Secondary | ICD-10-CM

## 2020-09-11 NOTE — Progress Notes (Signed)
I connected by phone with Ana Davis on 09/11/2020 at 5:59 PM to discuss the potential use of a new treatment for mild to moderate COVID-19 viral infection in non-hospitalized patients.  This patient is a 39 y.o. female that meets the FDA criteria for Emergency Use Authorization of COVID monoclonal antibody casirivimab/imdevimab.  Has a (+) direct SARS-CoV-2 viral test result  Has mild or moderate COVID-19   Is NOT hospitalized due to COVID-19  Is within 10 days of symptom onset  Has at least one of the high risk factor(s) for progression to severe COVID-19 and/or hospitalization as defined in EUA.  Specific high risk criteria : BMI > 25 and Other high risk medical condition per CDC:  socially vulnerable   I have spoken and communicated the following to the patient or parent/caregiver regarding COVID monoclonal antibody treatment:  1. FDA has authorized the emergency use for the treatment of mild to moderate COVID-19 in adults and pediatric patients with positive results of direct SARS-CoV-2 viral testing who are 77 years of age and older weighing at least 40 kg, and who are at high risk for progressing to severe COVID-19 and/or hospitalization.  2. The significant known and potential risks and benefits of COVID monoclonal antibody, and the extent to which such potential risks and benefits are unknown.  3. Information on available alternative treatments and the risks and benefits of those alternatives, including clinical trials.  4. Patients treated with COVID monoclonal antibody should continue to self-isolate and use infection control measures (e.g., wear mask, isolate, social distance, avoid sharing personal items, clean and disinfect "high touch" surfaces, and frequent handwashing) according to CDC guidelines.   5. The patient or parent/caregiver has the option to accept or refuse COVID monoclonal antibody treatment.  After reviewing this information with the patient, The  patient has DECLINED offer to receive the infusion.  Sx onset 9/11. Set up for infusion on 9/16 @ 2pm. Directions given to [facility]. Pt is aware that insurance will be charged an infusion fee. Pt is a Therapist, sports at [employer]. Pt is unvaccinated.   [name] 09/11/2020 5:59 PM

## 2020-09-12 ENCOUNTER — Ambulatory Visit (HOSPITAL_COMMUNITY)
Admission: RE | Admit: 2020-09-12 | Discharge: 2020-09-12 | Disposition: A | Discharge status: Home / Self Care | Payer: 59 | Source: Ambulatory Visit | Attending: Pulmonary Disease | Admitting: Pulmonary Disease

## 2020-09-12 ENCOUNTER — Other Ambulatory Visit (HOSPITAL_COMMUNITY): Payer: Self-pay

## 2020-09-12 DIAGNOSIS — Z6827 Body mass index (BMI) 27.0-27.9, adult: Secondary | ICD-10-CM | POA: Insufficient documentation

## 2020-09-12 DIAGNOSIS — Z23 Encounter for immunization: Secondary | ICD-10-CM | POA: Insufficient documentation

## 2020-09-12 DIAGNOSIS — U071 COVID-19: Secondary | ICD-10-CM | POA: Diagnosis not present

## 2020-09-12 MED ORDER — METHYLPREDNISOLONE SODIUM SUCC 125 MG IJ SOLR: 125.0000 mg | Freq: Once | INTRAMUSCULAR | Status: DC | PRN

## 2020-09-12 MED ORDER — DIPHENHYDRAMINE HCL 50 MG/ML IJ SOLN: 50.0000 mg | Freq: Once | INTRAMUSCULAR | Status: DC | PRN

## 2020-09-12 MED ORDER — EPINEPHRINE 0.3 MG/0.3ML IJ SOAJ: 0.3000 mg | Freq: Once | INTRAMUSCULAR | Status: DC | PRN

## 2020-09-12 MED ORDER — SODIUM CHLORIDE 0.9 % IV SOLN: INTRAVENOUS | Status: DC | PRN

## 2020-09-12 MED ORDER — SODIUM CHLORIDE 0.9 % IV SOLN
1200.0000 mg | Freq: Once | INTRAVENOUS | Status: AC
  Administered 2020-09-12: 1200 mg via INTRAVENOUS

## 2020-09-12 MED ORDER — FAMOTIDINE IN NACL 20-0.9 MG/50ML-% IV SOLN: 20.0000 mg | Freq: Once | INTRAVENOUS | Status: DC | PRN

## 2020-09-12 MED ORDER — ALBUTEROL SULFATE HFA 108 (90 BASE) MCG/ACT IN AERS: 2.0000 | INHALATION_SPRAY | Freq: Once | RESPIRATORY_TRACT | Status: DC | PRN

## 2020-09-12 MED FILL — ONDANSETRON HCL 4 MG TABS: 4 | 30 days supply | Qty: 90 | Fill #0

## 2020-09-12 MED FILL — AZITHROMYCIN 250 MG TABS: 250 | 10 days supply | Qty: 11 | Fill #0

## 2020-09-12 MED FILL — DEXAMETHASONE 6 MG TABLET: 6 | 10 days supply | Qty: 10 | Fill #0

## 2020-09-12 NOTE — Discharge Instructions (Signed)

## 2020-09-12 NOTE — Progress Notes (Signed)
  Diagnosis: COVID-19  Physician:Dr. Wright Procedure: [facility] Med: casirivimab\imdevimab infusion - Provided patient with casirivimab\imdevimab fact sheet for patients, parents and caregivers prior to infusion.  Complications: No immediate complications noted.  Discharge: Discharged home   Rudine Rieger S Renna Kilmer 09/12/2020  

## 2020-09-13 MED FILL — ALBUTEROL SULFATE HFA 108 (: 108 (90 BAS | 25 days supply | Qty: 18 | Fill #0

## 2020-09-24 MED FILL — ALBUTEROL SULFATE HFA 108 (: 108 (90 BAS | 25 days supply | Qty: 18 | Fill #0

## 2020-12-16 ENCOUNTER — Ambulatory Visit
Admission: RE | Admit: 2020-12-16 | Discharge: 2020-12-16 | Disposition: A | Discharge status: Home / Self Care | Payer: 59 | Source: Ambulatory Visit | Attending: Obstetrics & Gynecology | Admitting: Obstetrics & Gynecology

## 2020-12-16 ENCOUNTER — Other Ambulatory Visit: Payer: Self-pay

## 2020-12-16 ENCOUNTER — Other Ambulatory Visit: Payer: Self-pay | Admitting: Obstetrics & Gynecology

## 2020-12-16 DIAGNOSIS — N6310 Unspecified lump in the right breast, unspecified quadrant: Secondary | ICD-10-CM

## 2020-12-16 DIAGNOSIS — N6312 Unspecified lump in the right breast, upper inner quadrant: Secondary | ICD-10-CM | POA: Diagnosis not present

## 2020-12-24 DIAGNOSIS — Z20822 Contact with and (suspected) exposure to covid-19: Secondary | ICD-10-CM | POA: Diagnosis not present

## 2021-02-10 DIAGNOSIS — Z124 Encounter for screening for malignant neoplasm of cervix: Secondary | ICD-10-CM | POA: Diagnosis not present

## 2021-02-10 DIAGNOSIS — Z113 Encounter for screening for infections with a predominantly sexual mode of transmission: Secondary | ICD-10-CM | POA: Diagnosis not present

## 2021-02-10 DIAGNOSIS — D251 Intramural leiomyoma of uterus: Secondary | ICD-10-CM | POA: Diagnosis not present

## 2021-02-10 DIAGNOSIS — Z6826 Body mass index (BMI) 26.0-26.9, adult: Secondary | ICD-10-CM | POA: Diagnosis not present

## 2021-02-10 DIAGNOSIS — Z319 Encounter for procreative management, unspecified: Secondary | ICD-10-CM | POA: Diagnosis not present

## 2021-02-10 DIAGNOSIS — Z01411 Encounter for gynecological examination (general) (routine) with abnormal findings: Secondary | ICD-10-CM | POA: Diagnosis not present

## 2021-02-10 DIAGNOSIS — Z01419 Encounter for gynecological examination (general) (routine) without abnormal findings: Secondary | ICD-10-CM | POA: Diagnosis not present

## 2021-02-14 DIAGNOSIS — Z319 Encounter for procreative management, unspecified: Secondary | ICD-10-CM | POA: Diagnosis not present

## 2021-04-01 ENCOUNTER — Other Ambulatory Visit: Payer: Self-pay

## 2021-04-01 ENCOUNTER — Encounter: Payer: Self-pay | Admitting: Family Medicine

## 2021-04-01 ENCOUNTER — Ambulatory Visit: Payer: 59 | Admitting: Family Medicine

## 2021-04-01 VITALS — BP 107/66 | HR 60 | Ht 59.0 in | Wt 131.0 lb

## 2021-04-01 DIAGNOSIS — Z0000 Encounter for general adult medical examination without abnormal findings: Secondary | ICD-10-CM | POA: Diagnosis not present

## 2021-04-01 NOTE — Patient Instructions (Addendum)
Health Maintenance, Female Adopting a healthy lifestyle and getting preventive care are important in promoting health and wellness. Ask your health care provider about:  The right schedule for you to have regular tests and exams.  Things you can do on your own to prevent diseases and keep yourself healthy. What should I know about diet, weight, and exercise? Eat a healthy diet  Eat a diet that includes plenty of vegetables, fruits, low-fat dairy products, and lean protein.  Do not eat a lot of foods that are high in solid fats, added sugars, or sodium.   Maintain a healthy weight Body mass index (BMI) is used to identify weight problems. It estimates body fat based on height and weight. Your health care provider can help determine your BMI and help you achieve or maintain a healthy weight. Get regular exercise Get regular exercise. This is one of the most important things you can do for your health. Most adults should:  Exercise for at least 150 minutes each week. The exercise should increase your heart rate and make you sweat (moderate-intensity exercise).  Do strengthening exercises at least twice a week. This is in addition to the moderate-intensity exercise.  Spend less time sitting. Even light physical activity can be beneficial. Watch cholesterol and blood lipids Have your blood tested for lipids and cholesterol at 41 years of age, then have this test every 5 years. Have your cholesterol levels checked more often if:  Your lipid or cholesterol levels are high.  You are older than 40 years of age.  You are at high risk for heart disease. What should I know about cancer screening? Depending on your health history and family history, you may need to have cancer screening at various ages. This may include screening for:  Breast cancer.  Cervical cancer.  Colorectal cancer.  Skin cancer.  Lung cancer. What should I know about heart disease, diabetes, and high blood  pressure? Blood pressure and heart disease  High blood pressure causes heart disease and increases the risk of stroke. This is more likely to develop in people who have high blood pressure readings, are of African descent, or are overweight.  Have your blood pressure checked: ? Every 3-5 years if you are 18-39 years of age. ? Every year if you are 40 years old or older. Diabetes Have regular diabetes screenings. This checks your fasting blood sugar level. Have the screening done:  Once every three years after age 40 if you are at a normal weight and have a low risk for diabetes.  More often and at a younger age if you are overweight or have a high risk for diabetes. What should I know about preventing infection? Hepatitis B If you have a higher risk for hepatitis B, you should be screened for this virus. Talk with your health care provider to find out if you are at risk for hepatitis B infection. Hepatitis C Testing is recommended for:  Everyone born from 1945 through 1965.  Anyone with known risk factors for hepatitis C. Sexually transmitted infections (STIs)  Get screened for STIs, including gonorrhea and chlamydia, if: ? You are sexually active and are younger than 41 years of age. ? You are older than 41 years of age and your health care provider tells you that you are at risk for this type of infection. ? Your sexual activity has changed since you were last screened, and you are at increased risk for chlamydia or gonorrhea. Ask your health care provider   if you are at risk.  Ask your health care provider about whether you are at high risk for HIV. Your health care provider may recommend a prescription medicine to help prevent HIV infection. If you choose to take medicine to prevent HIV, you should first get tested for HIV. You should then be tested every 3 months for as long as you are taking the medicine. Pregnancy  If you are about to stop having your period (premenopausal) and  you may become pregnant, seek counseling before you get pregnant.  Take 400 to 800 micrograms (mcg) of folic acid every day if you become pregnant.  Ask for birth control (contraception) if you want to prevent pregnancy. Osteoporosis and menopause Osteoporosis is a disease in which the bones lose minerals and strength with aging. This can result in bone fractures. If you are 65 years old or older, or if you are at risk for osteoporosis and fractures, ask your health care provider if you should:  Be screened for bone loss.  Take a calcium or vitamin D supplement to lower your risk of fractures.  Be given hormone replacement therapy (HRT) to treat symptoms of menopause. Follow these instructions at home: Lifestyle  Do not use any products that contain nicotine or tobacco, such as cigarettes, e-cigarettes, and chewing tobacco. If you need help quitting, ask your health care provider.  Do not use street drugs.  Do not share needles.  Ask your health care provider for help if you need support or information about quitting drugs. Alcohol use  Do not drink alcohol if: ? Your health care provider tells you not to drink. ? You are pregnant, may be pregnant, or are planning to become pregnant.  If you drink alcohol: ? Limit how much you use to 0-1 drink a day. ? Limit intake if you are breastfeeding.  Be aware of how much alcohol is in your drink. In the [place] one drink equals one 12 oz bottle of beer (355 mL), one 5 oz glass of wine (148 mL), or one 1 oz glass of hard liquor (44 mL). General instructions  Schedule regular health, dental, and eye exams.  Stay current with your vaccines.  Tell your health care provider if: ? You often feel depressed. ? You have ever been abused or do not feel safe at home. Summary  Adopting a healthy lifestyle and getting preventive care are important in promoting health and wellness.  Follow your health care provider's instructions about healthy  diet, exercising, and getting tested or screened for diseases.  Follow your health care provider's instructions on monitoring your cholesterol and blood pressure. This information is not intended to replace advice given to you by your health care provider. Make sure you discuss any questions you have with your health care provider. Document Revised: 12/07/2018 Document Reviewed: 12/07/2018 Elsevier Patient Education  2021 Elsevier Inc.  

## 2021-04-01 NOTE — Progress Notes (Signed)
Patient [facility]  ANNUAL PREVENTATIVE VISIT AND CPE  Subjective:  Ana Davis is a 40 y.o. female who presents for Annual Wellness Visit and complete physical.    Patient says that she has been doing well and is without complaint on today.  Is been greater than 1 year since previous physical.  She says that she is generally healthy.  She works as an Insurance account manager.  Patient is married with 1 son.  She generally works third shift.  She gets about 4-5 hours of sleep per day.  Patient exercises 3-5 times per week.  She mostly eats a low-fat diet divided over small meals. Patient wears sunscreen and wears a seatbelt.  She typically has yearly dental visits.  She is not routinely had a eye exam she does not wear glasses or contacts.  Patient is up-to-date with Pap smear and mammogram.  Review of Systems  Constitutional: Negative for chills and fever.  HENT: Negative.   Eyes: Negative.  Negative for blurred vision and double vision.  Respiratory: Negative.  Negative for cough and shortness of breath.   Cardiovascular: Negative for chest pain.  Gastrointestinal: Negative.  Negative for abdominal pain, heartburn, nausea and vomiting.  Genitourinary: Negative.   Musculoskeletal: Negative.  Negative for back pain and joint pain.  Skin: Negative.   Neurological: Negative.   Endo/Heme/Allergies: Negative.  Negative for environmental allergies and polydipsia. Does not bruise/bleed easily.  Psychiatric/Behavioral: Negative.  Negative for depression.     Lab Results  Component Value Date   CHOL 130 01/31/2020   HDL 57 01/31/2020   LDLCALC 63 01/31/2020   TRIG 42 01/31/2020   CHOLHDL 2.3 01/31/2020   Lab Results  Component Value Date   HGBA1C 5.1 02/22/2019   Patient is on Vitamin D supplement.   Lab Results  Component Value Date   VD25OH 40.8 01/31/2020       Names of Other Physician/Practitioners you currently  use: Patient Care Team: Dorena Dew, FNP as PCP - General (Family Medicine)   Medication Review: Current Outpatient Medications on File Prior to Visit  Medication Sig Dispense Refill  . acetaminophen (TYLENOL) 500 MG tablet Take 500-1,000 mg by mouth 2 (two) times daily as needed for mild pain.    . Ascorbic Acid (VITAMIN C) 1000 MG tablet Take 1,000 mg by mouth daily.    . cetirizine (ZYRTEC) 10 MG tablet Take 10 mg by mouth daily as needed for allergies.     . Cholecalciferol (VITAMIN D) 125 MCG (5000 UT) CAPS Take by mouth.    . ELDERBERRY PO Take by mouth.    Marland Kitchen ibuprofen (ADVIL,MOTRIN) 600 MG tablet Take 1 tablet (600 mg total) by mouth every 6 (six) hours as needed for mild pain. 30 tablet 0  . Multiple Vitamins-Minerals (ZINC PO) Take by mouth.     No current facility-administered medications on file prior to visit.    Current Problems (verified) Patient Active Problem List   Diagnosis Date Noted  . Endometritis following delivery 06/24/2017  . S/P C-section Indication: breech 06/14/2017  . Fibroids 11/06/2015  . Cervical high risk HPV (human papillomavirus) test positive 10/04/2014    Screening Tests Health Maintenance  Topic Date Due  . Hepatitis C Screening  Never done  . INFLUENZA VACCINE  07/28/2021  . PAP SMEAR-Modifier  03/28/2024  . TETANUS/TDAP  03/30/2027  . HIV Screening  Completed  . HPV VACCINES  Aged Out  Immunization History  Administered Date(s) Administered  . Hepatitis A 02/16/2012  . Influenza Split 09/18/2014  . Influenza,inj,Quad PF,6+ Mos 09/27/2001  . Influenza-Unspecified 09/29/2016  . Typhoid Inactivated 02/16/2012  . Yellow Fever 02/16/2012    Allergies as of 04/01/2021      Reactions   Hydrocodone    Hallucinations      Medication List       Accurate as of April 01, 2021  9:24 AM. If you have any questions, ask your nurse or doctor.        acetaminophen 500 MG tablet Commonly known as: TYLENOL Take 500-1,000 mg by  mouth 2 (two) times daily as needed for mild pain.   cetirizine 10 MG tablet Commonly known as: ZYRTEC Take 10 mg by mouth daily as needed for allergies.   ELDERBERRY PO Take by mouth.   ibuprofen 600 MG tablet Commonly known as: ADVIL Take 1 tablet (600 mg total) by mouth every 6 (six) hours as needed for mild pain.   vitamin C 1000 MG tablet Take 1,000 mg by mouth daily.   Vitamin D 125 MCG (5000 UT) Caps Take by mouth.   ZINC PO Take by mouth.       Past Surgical History:  Procedure Laterality Date  . CESAREAN SECTION N/A 06/14/2017   Procedure: Primary CESAREAN SECTION;  Surgeon: Azucena Fallen, MD;  Location: [facility];  Service: Obstetrics;  Laterality: N/A;  EDD: 06/20/17 Allergy: Codeine  . COLONOSCOPY  08/2015  . WISDOM TOOTH EXTRACTION     Family History  Problem Relation Age of Onset  . Cancer Maternal Grandmother        great gran- breast  . Seizures Maternal Grandmother   . Breast cancer Maternal Grandmother   . Diabetes Maternal Grandfather   . Kidney disease Maternal Grandfather   . Hypertension Father   . Arthritis Paternal Grandfather     History reviewed: allergies, current medications, past family history, past medical history, past social history, past surgical history and problem list   Tobacco Social History   Tobacco Use  . Smoking status: Never Smoker  . Smokeless tobacco: Never Used  Substance Use Topics  . Alcohol use: Yes    Comment: social  . Drug use: No   She does not smoke.   Alcohol Current alcohol use: none  Caffeine Current caffeine use: denies use  Exercise Current exercise: running/ jogging  Nutrition/Diet Current diet: in general, a "healthy" diet    Cardiac risk factors: family history of premature cardiovascular disease.      Objective:     Blood pressure 107/66, pulse 60, height 4\' 11"  (1.499 m), weight 131 lb (59.4 kg), last menstrual period 03/13/2021, SpO2 100 %, unknown if currently  breastfeeding. Body mass index is 26.46 kg/m.  General appearance: alert, no distress, WD/WN, female  HEENT: normocephalic, sclerae anicteric, TMs pearly, nares patent, no discharge or erythema, pharynx normal Oral cavity: MMM, no lesions Neck: supple, no lymphadenopathy, no thyromegaly, no masses Heart: RRR, normal S1, S2, no murmurs Lungs: CTA bilaterally, no wheezes, rhonchi, or rales Abdomen: +bs, soft, non tender, non distended, no masses, no hepatomegaly, no splenomegaly Musculoskeletal: nontender, no swelling, no obvious deformity Extremities: no edema, no cyanosis, no clubbing Pulses: 2+ symmetric, upper and lower extremities, normal cap refill Neurological: alert, oriented x 3, CN2-12 intact, strength normal upper extremities and lower extremities, sensation normal throughout, DTRs 2+ throughout, no cerebellar signs, gait normal Psychiatric: normal affect, behavior normal, pleasant   Assessment:  Patient denies any difficulties at home. No trouble with ADLs, depression or falls. No recent changes to vision or hearing. Is UTD with immunizations. Is UTD with screening.     Plan:   During the course of the visit the patient was educated and counseled about appropriate screening and preventive services including:    Pneumococcal vaccine   Influenza vaccine  Td vaccine  Diabetes screening  Nutrition counseling   Advanced directives: requested     Healthy adult on routine physical examination - Hepatitis C antibody - VITAMIN D 25 Hydroxy (Vit-D Deficiency, Fractures) - CBC with Differential - Comprehensive metabolic panel - Lipid Panel - TSH - HgB A1c - Urinalysis Dipstick     Plan: Depression floss daily Wear sunscreen Wear seatbelts Continue smoking cessation 34 ounces of water per day Exercise 3-5 days/week Low-fat, low carbohydrate diet divided over small meals 6-8 hours of sleep per day Yearly eye exam COVID-19 vaccine Influenza  vaccine      [name]  APRN, MSN, FNP-C Patient [facility] [address] [place], [place] [address] [phone]

## 2021-04-02 LAB — CBC WITH DIFFERENTIAL/PLATELET
Basophils Absolute: 0 10*3/uL (ref 0.0–0.2)
Basos: 0 %
EOS (ABSOLUTE): 0 10*3/uL (ref 0.0–0.4)
Eos: 1 %
Hematocrit: 38.2 % (ref 34.0–46.6)
Hemoglobin: 12.7 g/dL (ref 11.1–15.9)
Immature Grans (Abs): 0 10*3/uL (ref 0.0–0.1)
Immature Granulocytes: 0 %
Lymphocytes Absolute: 2 10*3/uL (ref 0.7–3.1)
Lymphs: 40 %
MCH: 31.9 pg (ref 26.6–33.0)
MCHC: 33.2 g/dL (ref 31.5–35.7)
MCV: 96 fL (ref 79–97)
Monocytes Absolute: 0.3 10*3/uL (ref 0.1–0.9)
Monocytes: 6 %
Neutrophils Absolute: 2.6 10*3/uL (ref 1.4–7.0)
Neutrophils: 53 %
Platelets: 247 10*3/uL (ref 150–450)
RBC: 3.98 x10E6/uL (ref 3.77–5.28)
RDW: 12.2 % (ref 11.7–15.4)
WBC: 4.9 10*3/uL (ref 3.4–10.8)

## 2021-04-02 LAB — TSH: TSH: 2.12 u[IU]/mL (ref 0.450–4.500)

## 2021-04-02 LAB — COMPREHENSIVE METABOLIC PANEL
ALT: 12 IU/L (ref 0–32)
AST: 16 IU/L (ref 0–40)
Albumin/Globulin Ratio: 1.5 (ref 1.2–2.2)
Albumin: 4.5 g/dL (ref 3.8–4.8)
Alkaline Phosphatase: 64 IU/L (ref 44–121)
BUN/Creatinine Ratio: 9 (ref 9–23)
BUN: 8 mg/dL (ref 6–24)
Bilirubin Total: 0.6 mg/dL (ref 0.0–1.2)
CO2: 22 mmol/L (ref 20–29)
Calcium: 9 mg/dL (ref 8.7–10.2)
Chloride: 100 mmol/L (ref 96–106)
Creatinine, Ser: 0.9 mg/dL (ref 0.57–1.00)
Globulin, Total: 3 g/dL (ref 1.5–4.5)
Glucose: 88 mg/dL (ref 65–99)
Potassium: 4.1 mmol/L (ref 3.5–5.2)
Sodium: 137 mmol/L (ref 134–144)
Total Protein: 7.5 g/dL (ref 6.0–8.5)
eGFR: 83 mL/min/{1.73_m2} (ref >59)

## 2021-04-02 LAB — LIPID PANEL
Chol/HDL Ratio: 2.3 ratio (ref 0.0–4.4)
Cholesterol, Total: 153 mg/dL (ref 100–199)
HDL: 68 mg/dL (ref >39)
LDL Chol Calc (NIH): 74 mg/dL (ref 0–99)
Triglycerides: 50 mg/dL (ref 0–149)
VLDL Cholesterol Cal: 11 mg/dL (ref 5–40)

## 2021-04-02 LAB — HEPATITIS C ANTIBODY: Hep C Virus Ab: <0.1 s/co ratio (ref 0.0–0.9)

## 2021-04-02 LAB — VITAMIN D 25 HYDROXY (VIT D DEFICIENCY, FRACTURES): Vit D, 25-Hydroxy: 45.4 ng/mL (ref 30.0–100.0)

## 2021-04-07 ENCOUNTER — Telehealth: Payer: Self-pay | Admitting: Family Medicine

## 2021-04-07 NOTE — Telephone Encounter (Signed)
Ana Davis was seen on 04/01/2021 for annual physical exam.  Reviewed all laboratory values.  All values within a normal range.  Recommend the following: Low-fat, low carbohydrate diet divided over small meals throughout the day Continue current exercise routine Hydrate with 32 ounces of fluid per day Recommend dental visits twice yearly for cleanings Recommend yearly eye exam Continue wearing sunscreen during high exposure Wear seatbelts Recommend Pap smear as directed by gynecology   [name]  APRN, MSN, FNP-C Patient [facility] [address] [place],  [address] [phone]

## 2021-04-08 NOTE — Telephone Encounter (Signed)
Mychart message sent with results and recommendations.

## 2021-05-07 DIAGNOSIS — Z3689 Encounter for other specified antenatal screening: Secondary | ICD-10-CM | POA: Diagnosis not present

## 2021-05-07 DIAGNOSIS — Z3200 Encounter for pregnancy test, result unknown: Secondary | ICD-10-CM | POA: Diagnosis not present

## 2021-05-16 DIAGNOSIS — Z3201 Encounter for pregnancy test, result positive: Secondary | ICD-10-CM | POA: Diagnosis not present

## 2021-05-28 ENCOUNTER — Other Ambulatory Visit (HOSPITAL_COMMUNITY): Payer: Self-pay

## 2021-05-28 DIAGNOSIS — O99891 Other specified diseases and conditions complicating pregnancy: Secondary | ICD-10-CM | POA: Diagnosis not present

## 2021-05-28 DIAGNOSIS — D259 Leiomyoma of uterus, unspecified: Secondary | ICD-10-CM | POA: Diagnosis not present

## 2021-05-28 DIAGNOSIS — Z8759 Personal history of other complications of pregnancy, childbirth and the puerperium: Secondary | ICD-10-CM | POA: Diagnosis not present

## 2021-05-28 DIAGNOSIS — Z118 Encounter for screening for other infectious and parasitic diseases: Secondary | ICD-10-CM | POA: Diagnosis not present

## 2021-05-28 DIAGNOSIS — Z3689 Encounter for other specified antenatal screening: Secondary | ICD-10-CM | POA: Diagnosis not present

## 2021-05-28 DIAGNOSIS — B373 Candidiasis of vulva and vagina: Secondary | ICD-10-CM | POA: Diagnosis not present

## 2021-05-28 DIAGNOSIS — O09891 Supervision of other high risk pregnancies, first trimester: Secondary | ICD-10-CM | POA: Diagnosis not present

## 2021-05-28 DIAGNOSIS — Z3A10 10 weeks gestation of pregnancy: Secondary | ICD-10-CM | POA: Diagnosis not present

## 2021-05-28 DIAGNOSIS — O09521 Supervision of elderly multigravida, first trimester: Secondary | ICD-10-CM | POA: Diagnosis not present

## 2021-05-28 LAB — OB RESULTS CONSOLE RUBELLA ANTIBODY, IGM: Rubella: IMMUNE

## 2021-05-28 LAB — OB RESULTS CONSOLE HIV ANTIBODY (ROUTINE TESTING): HIV: NONREACTIVE

## 2021-05-28 LAB — OB RESULTS CONSOLE RPR: RPR: NONREACTIVE

## 2021-05-28 LAB — OB RESULTS CONSOLE HEPATITIS B SURFACE ANTIGEN: Hepatitis B Surface Ag: NEGATIVE

## 2021-05-28 MED ORDER — TERCONAZOLE 0.4 % VA CREA
TOPICAL_CREAM | VAGINAL | 0 refills | Status: DC
  Filled 2021-05-28: qty 45, 7d supply, fill #0

## 2021-06-23 ENCOUNTER — Other Ambulatory Visit: Payer: 59

## 2021-06-26 DIAGNOSIS — O3412 Maternal care for benign tumor of corpus uteri, second trimester: Secondary | ICD-10-CM | POA: Diagnosis not present

## 2021-06-26 DIAGNOSIS — O09522 Supervision of elderly multigravida, second trimester: Secondary | ICD-10-CM | POA: Diagnosis not present

## 2021-06-26 DIAGNOSIS — O09892 Supervision of other high risk pregnancies, second trimester: Secondary | ICD-10-CM | POA: Diagnosis not present

## 2021-06-26 DIAGNOSIS — Z3A15 15 weeks gestation of pregnancy: Secondary | ICD-10-CM | POA: Diagnosis not present

## 2021-06-26 DIAGNOSIS — O99012 Anemia complicating pregnancy, second trimester: Secondary | ICD-10-CM | POA: Diagnosis not present

## 2021-06-26 DIAGNOSIS — Z361 Encounter for antenatal screening for raised alphafetoprotein level: Secondary | ICD-10-CM | POA: Diagnosis not present

## 2021-06-26 DIAGNOSIS — Z8759 Personal history of other complications of pregnancy, childbirth and the puerperium: Secondary | ICD-10-CM | POA: Diagnosis not present

## 2021-06-26 DIAGNOSIS — O99891 Other specified diseases and conditions complicating pregnancy: Secondary | ICD-10-CM | POA: Diagnosis not present

## 2021-07-03 IMAGING — US US BREAST*R* LIMITED INC AXILLA
1 series · 5 of 5 positions shown · non-contrast
Comparison: Previous exam(s).

CLINICAL DATA: Patient recalled from screening for right breast
mass.

EXAM:
DIGITAL DIAGNOSTIC RIGHT MAMMOGRAM WITH CAD AND TOMO
ULTRASOUND RIGHT BREAST

[Series 1: us breast*right* limited inc axilla · 0.06mm/px · 5 of 5 slices shown]
[im 1/5]
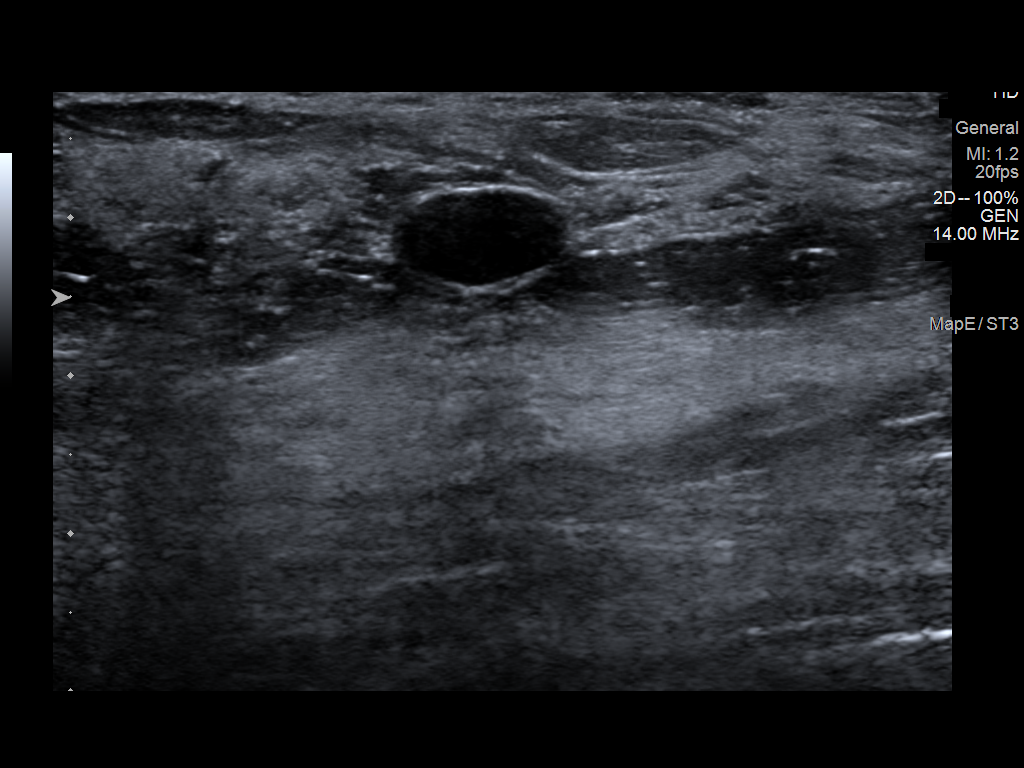
[im 2/5]
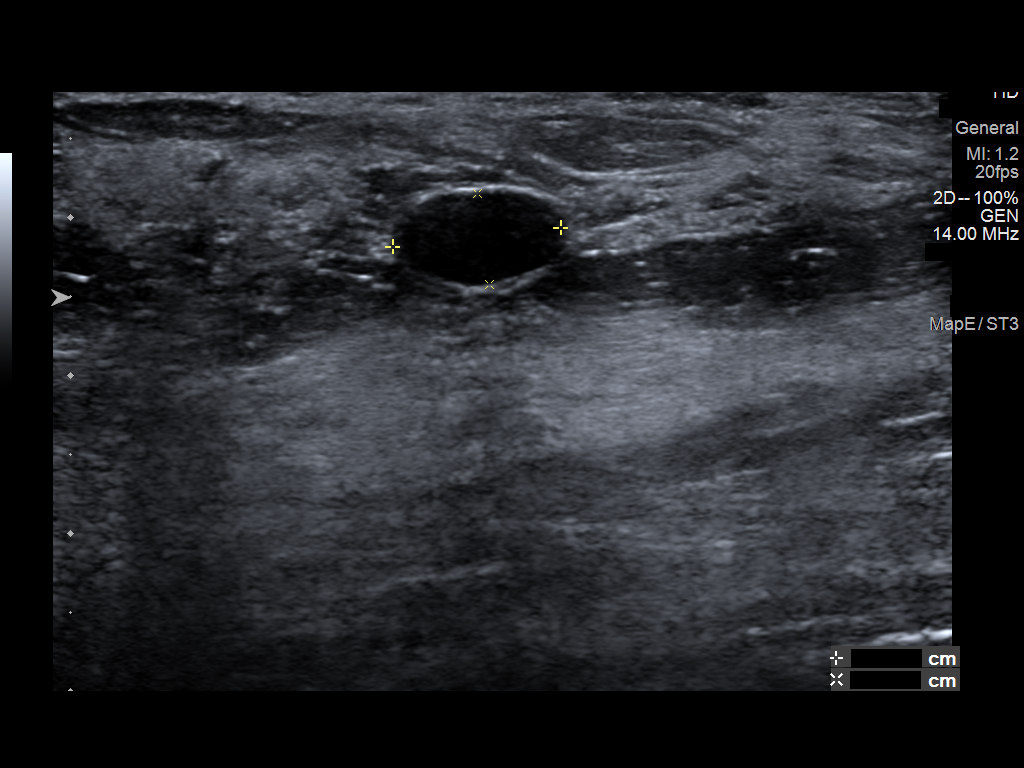
[im 3/5]
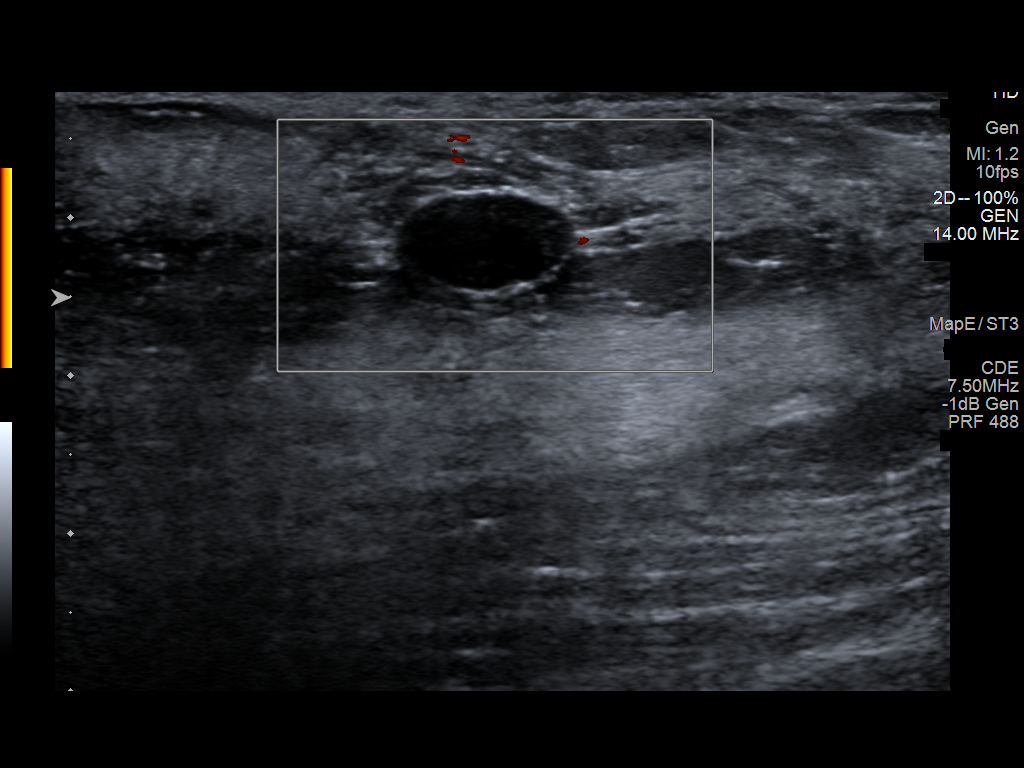
[im 4/5]
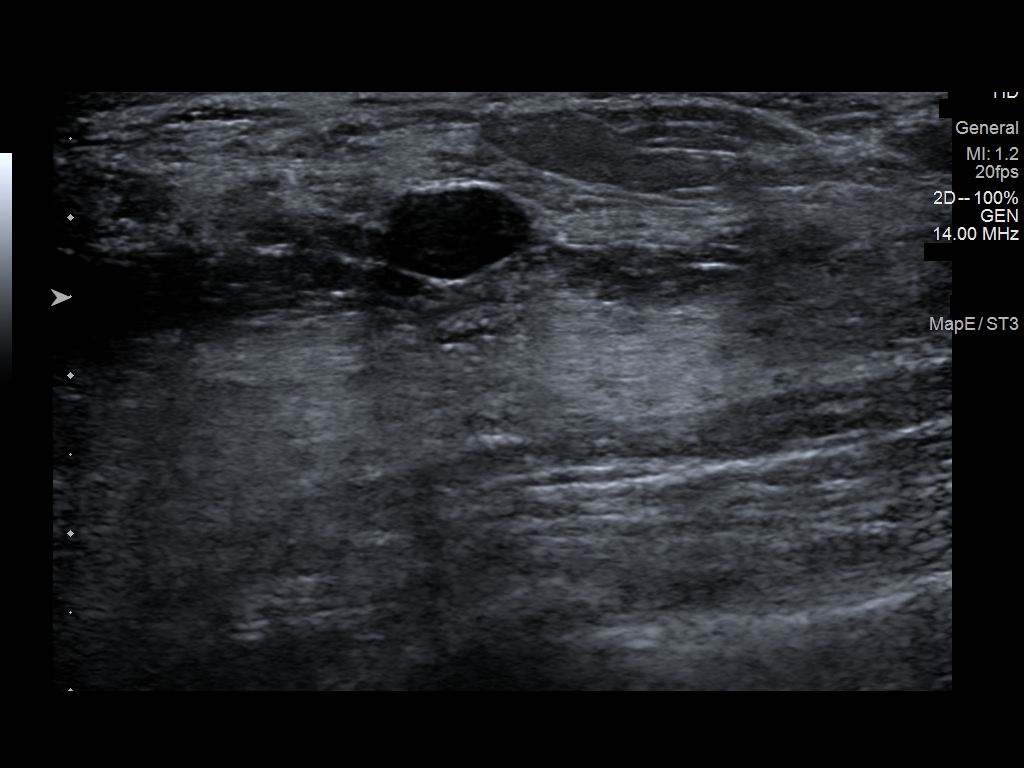
[im 5/5]
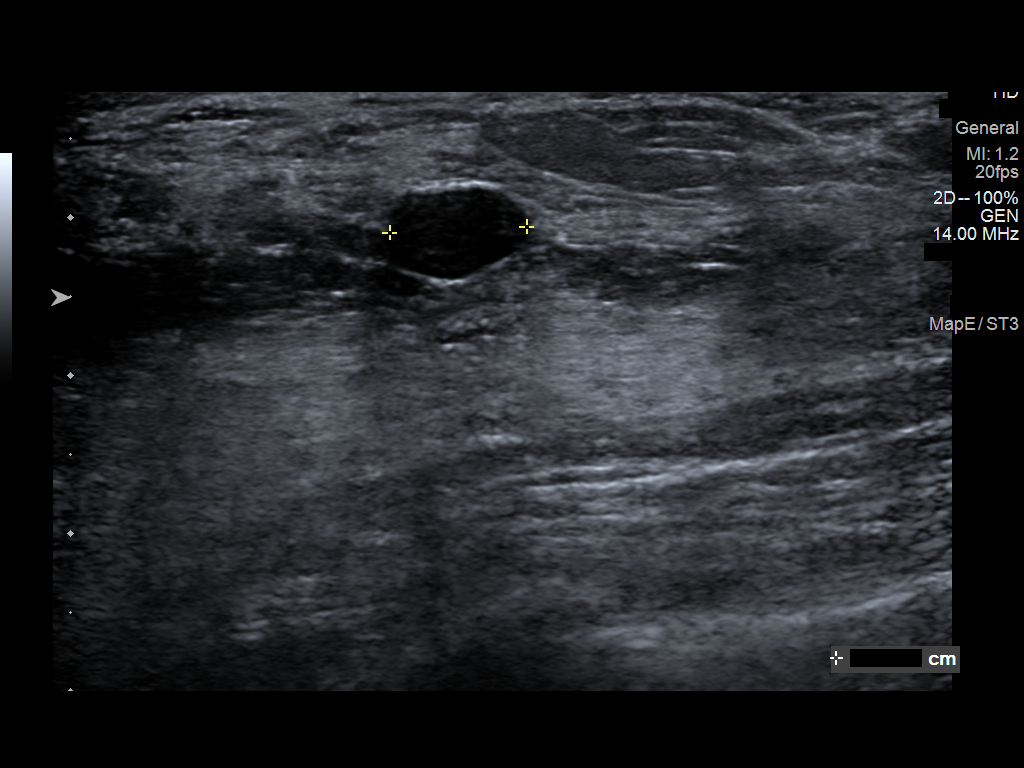

[5 of 5 positions shown; findings below may reference images not displayed]

ACR Breast Density Category b: There are scattered areas of
fibroglandular density.
FINDINGS: Persistent oval circumscribed mass within medial aspect of the right
breast, further evaluated with spot compression views.

Mammographic images were processed with CAD.

Targeted ultrasound is performed, showing a 1.1 x 0.6 x 0.9 cm oval
circumscribed hypoechoic mass right breast 2 o'clock position 1 cm
from the nipple.
IMPRESSION: Probably benign right breast mass 2 o'clock position, favored to
represent a fibroadenoma or complicated cyst.

RECOMMENDATION:
Right breast ultrasound in 6 months to reassess probably benign
right breast mass 2 o'clock position.

I have discussed the findings and recommendations with the patient.
If applicable, a reminder letter will be sent to the patient
regarding the next appointment.

BI-RADS CATEGORY  3: Probably benign.

## 2021-07-09 ENCOUNTER — Ambulatory Visit
Admission: RE | Admit: 2021-07-09 | Discharge: 2021-07-09 | Disposition: A | Discharge status: Home / Self Care | Payer: 59 | Source: Ambulatory Visit | Attending: Obstetrics & Gynecology | Admitting: Obstetrics & Gynecology

## 2021-07-09 ENCOUNTER — Other Ambulatory Visit: Payer: Self-pay

## 2021-07-09 DIAGNOSIS — N6312 Unspecified lump in the right breast, upper inner quadrant: Secondary | ICD-10-CM | POA: Diagnosis not present

## 2021-07-09 DIAGNOSIS — N6310 Unspecified lump in the right breast, unspecified quadrant: Secondary | ICD-10-CM

## 2021-07-25 DIAGNOSIS — O99012 Anemia complicating pregnancy, second trimester: Secondary | ICD-10-CM | POA: Diagnosis not present

## 2021-07-25 DIAGNOSIS — Z3A21 21 weeks gestation of pregnancy: Secondary | ICD-10-CM | POA: Diagnosis not present

## 2021-07-25 DIAGNOSIS — O09522 Supervision of elderly multigravida, second trimester: Secondary | ICD-10-CM | POA: Diagnosis not present

## 2021-07-25 DIAGNOSIS — O09892 Supervision of other high risk pregnancies, second trimester: Secondary | ICD-10-CM | POA: Diagnosis not present

## 2021-09-16 ENCOUNTER — Other Ambulatory Visit (HOSPITAL_COMMUNITY): Payer: Self-pay

## 2021-09-16 MED ORDER — FLUCONAZOLE 150 MG PO TABS
ORAL_TABLET | ORAL | 0 refills | Status: DC
  Filled 2021-09-16: qty 1, 1d supply, fill #0

## 2021-09-30 DIAGNOSIS — O99013 Anemia complicating pregnancy, third trimester: Secondary | ICD-10-CM | POA: Diagnosis not present

## 2021-09-30 DIAGNOSIS — Z23 Encounter for immunization: Secondary | ICD-10-CM | POA: Diagnosis not present

## 2021-09-30 DIAGNOSIS — O09893 Supervision of other high risk pregnancies, third trimester: Secondary | ICD-10-CM | POA: Diagnosis not present

## 2021-09-30 DIAGNOSIS — Z3689 Encounter for other specified antenatal screening: Secondary | ICD-10-CM | POA: Diagnosis not present

## 2021-09-30 DIAGNOSIS — O360130 Maternal care for anti-D [Rh] antibodies, third trimester, not applicable or unspecified: Secondary | ICD-10-CM | POA: Diagnosis not present

## 2021-09-30 DIAGNOSIS — Z3A28 28 weeks gestation of pregnancy: Secondary | ICD-10-CM | POA: Diagnosis not present

## 2021-09-30 DIAGNOSIS — O3413 Maternal care for benign tumor of corpus uteri, third trimester: Secondary | ICD-10-CM | POA: Diagnosis not present

## 2021-09-30 DIAGNOSIS — O09523 Supervision of elderly multigravida, third trimester: Secondary | ICD-10-CM | POA: Diagnosis not present

## 2021-10-06 ENCOUNTER — Other Ambulatory Visit (HOSPITAL_COMMUNITY): Payer: Self-pay

## 2021-10-06 DIAGNOSIS — O99810 Abnormal glucose complicating pregnancy: Secondary | ICD-10-CM | POA: Diagnosis not present

## 2021-10-06 DIAGNOSIS — Z3A29 29 weeks gestation of pregnancy: Secondary | ICD-10-CM | POA: Diagnosis not present

## 2021-10-06 MED ORDER — FREESTYLE LITE TEST VI STRP
ORAL_STRIP | 2 refills | Status: DC
  Filled 2021-10-06: qty 100, 25d supply, fill #0

## 2021-10-06 MED ORDER — LANCING DEVICE MISC: 0 refills | Status: DC

## 2021-10-06 MED ORDER — FREESTYLE LITE W/DEVICE KIT
PACK | 0 refills | Status: DC
  Filled 2021-10-06: qty 1, 90d supply, fill #0

## 2021-10-06 MED ORDER — FREESTYLE LANCETS MISC
2 refills | Status: DC
  Filled 2021-10-06: qty 100, 25d supply, fill #0

## 2021-10-08 ENCOUNTER — Other Ambulatory Visit (HOSPITAL_COMMUNITY): Payer: Self-pay

## 2021-10-08 DIAGNOSIS — O24419 Gestational diabetes mellitus in pregnancy, unspecified control: Secondary | ICD-10-CM | POA: Diagnosis not present

## 2021-10-08 DIAGNOSIS — O09893 Supervision of other high risk pregnancies, third trimester: Secondary | ICD-10-CM | POA: Diagnosis not present

## 2021-10-08 DIAGNOSIS — O09523 Supervision of elderly multigravida, third trimester: Secondary | ICD-10-CM | POA: Diagnosis not present

## 2021-10-08 DIAGNOSIS — O360130 Maternal care for anti-D [Rh] antibodies, third trimester, not applicable or unspecified: Secondary | ICD-10-CM | POA: Diagnosis not present

## 2021-10-08 DIAGNOSIS — O479 False labor, unspecified: Secondary | ICD-10-CM | POA: Diagnosis not present

## 2021-10-08 DIAGNOSIS — Z3A29 29 weeks gestation of pregnancy: Secondary | ICD-10-CM | POA: Diagnosis not present

## 2021-10-08 MED ORDER — NIFEDIPINE 10 MG PO CAPS
ORAL_CAPSULE | ORAL | 0 refills | Status: DC
  Filled 2021-10-08: qty 30, 15d supply, fill #0

## 2021-10-09 ENCOUNTER — Other Ambulatory Visit (HOSPITAL_COMMUNITY): Payer: Self-pay

## 2021-11-11 DIAGNOSIS — O24410 Gestational diabetes mellitus in pregnancy, diet controlled: Secondary | ICD-10-CM | POA: Diagnosis not present

## 2021-11-11 DIAGNOSIS — O3413 Maternal care for benign tumor of corpus uteri, third trimester: Secondary | ICD-10-CM | POA: Diagnosis not present

## 2021-11-11 DIAGNOSIS — O09893 Supervision of other high risk pregnancies, third trimester: Secondary | ICD-10-CM | POA: Diagnosis not present

## 2021-11-11 DIAGNOSIS — Z364 Encounter for antenatal screening for fetal growth retardation: Secondary | ICD-10-CM | POA: Diagnosis not present

## 2021-11-11 DIAGNOSIS — O99013 Anemia complicating pregnancy, third trimester: Secondary | ICD-10-CM | POA: Diagnosis not present

## 2021-11-11 DIAGNOSIS — O360130 Maternal care for anti-D [Rh] antibodies, third trimester, not applicable or unspecified: Secondary | ICD-10-CM | POA: Diagnosis not present

## 2021-11-11 DIAGNOSIS — Z3A34 34 weeks gestation of pregnancy: Secondary | ICD-10-CM | POA: Diagnosis not present

## 2021-11-11 DIAGNOSIS — O09523 Supervision of elderly multigravida, third trimester: Secondary | ICD-10-CM | POA: Diagnosis not present

## 2021-11-12 ENCOUNTER — Other Ambulatory Visit: Payer: Self-pay

## 2021-11-12 ENCOUNTER — Encounter (HOSPITAL_COMMUNITY): Payer: Self-pay | Admitting: Obstetrics & Gynecology

## 2021-11-12 ENCOUNTER — Inpatient Hospital Stay (HOSPITAL_COMMUNITY)
Admission: AD | Admit: 2021-11-12 | Discharge: 2021-11-17 | DRG: 786 | Disposition: A | Discharge status: Home / Self Care | Payer: 59 | Attending: Obstetrics & Gynecology | Admitting: Obstetrics & Gynecology

## 2021-11-12 ENCOUNTER — Other Ambulatory Visit: Payer: Self-pay | Admitting: Obstetrics & Gynecology

## 2021-11-12 DIAGNOSIS — O4290 Premature rupture of membranes, unspecified as to length of time between rupture and onset of labor, unspecified weeks of gestation: Secondary | ICD-10-CM | POA: Diagnosis present

## 2021-11-12 DIAGNOSIS — D259 Leiomyoma of uterus, unspecified: Secondary | ICD-10-CM | POA: Diagnosis present

## 2021-11-12 DIAGNOSIS — Z302 Encounter for sterilization: Secondary | ICD-10-CM | POA: Diagnosis not present

## 2021-11-12 DIAGNOSIS — J45909 Unspecified asthma, uncomplicated: Secondary | ICD-10-CM | POA: Diagnosis present

## 2021-11-12 DIAGNOSIS — O3413 Maternal care for benign tumor of corpus uteri, third trimester: Secondary | ICD-10-CM | POA: Diagnosis present

## 2021-11-12 DIAGNOSIS — Z3A35 35 weeks gestation of pregnancy: Secondary | ICD-10-CM | POA: Diagnosis not present

## 2021-11-12 DIAGNOSIS — Z6791 Unspecified blood type, Rh negative: Secondary | ICD-10-CM

## 2021-11-12 DIAGNOSIS — Z3A34 34 weeks gestation of pregnancy: Secondary | ICD-10-CM | POA: Diagnosis not present

## 2021-11-12 DIAGNOSIS — O9952 Diseases of the respiratory system complicating childbirth: Secondary | ICD-10-CM | POA: Diagnosis present

## 2021-11-12 DIAGNOSIS — O42912 Preterm premature rupture of membranes, unspecified as to length of time between rupture and onset of labor, second trimester: Secondary | ICD-10-CM

## 2021-11-12 DIAGNOSIS — D62 Acute posthemorrhagic anemia: Secondary | ICD-10-CM | POA: Diagnosis not present

## 2021-11-12 DIAGNOSIS — Z20822 Contact with and (suspected) exposure to covid-19: Secondary | ICD-10-CM | POA: Diagnosis not present

## 2021-11-12 DIAGNOSIS — O24420 Gestational diabetes mellitus in childbirth, diet controlled: Secondary | ICD-10-CM | POA: Diagnosis present

## 2021-11-12 DIAGNOSIS — O42913 Preterm premature rupture of membranes, unspecified as to length of time between rupture and onset of labor, third trimester: Principal | ICD-10-CM | POA: Diagnosis present

## 2021-11-12 DIAGNOSIS — O34211 Maternal care for low transverse scar from previous cesarean delivery: Secondary | ICD-10-CM | POA: Diagnosis not present

## 2021-11-12 DIAGNOSIS — Z8759 Personal history of other complications of pregnancy, childbirth and the puerperium: Secondary | ICD-10-CM

## 2021-11-12 DIAGNOSIS — O9081 Anemia of the puerperium: Secondary | ICD-10-CM | POA: Diagnosis not present

## 2021-11-12 DIAGNOSIS — O26893 Other specified pregnancy related conditions, third trimester: Secondary | ICD-10-CM | POA: Diagnosis present

## 2021-11-12 DIAGNOSIS — O42113 Preterm premature rupture of membranes, onset of labor more than 24 hours following rupture, third trimester: Secondary | ICD-10-CM | POA: Diagnosis not present

## 2021-11-12 DIAGNOSIS — Z412 Encounter for routine and ritual male circumcision: Secondary | ICD-10-CM | POA: Diagnosis not present

## 2021-11-12 DIAGNOSIS — O34219 Maternal care for unspecified type scar from previous cesarean delivery: Secondary | ICD-10-CM | POA: Diagnosis not present

## 2021-11-12 DIAGNOSIS — O42013 Preterm premature rupture of membranes, onset of labor within 24 hours of rupture, third trimester: Secondary | ICD-10-CM | POA: Diagnosis not present

## 2021-11-12 DIAGNOSIS — Z98891 History of uterine scar from previous surgery: Secondary | ICD-10-CM

## 2021-11-12 LAB — CBC
HCT: 37.9 % (ref 36.0–46.0)
Hemoglobin: 12.7 g/dL (ref 12.0–15.0)
MCH: 32.4 pg (ref 26.0–34.0)
MCHC: 33.5 g/dL (ref 30.0–36.0)
MCV: 96.7 fL (ref 80.0–100.0)
Platelets: 225 10*3/uL (ref 150–400)
RBC: 3.92 MIL/uL (ref 3.87–5.11)
RDW: 13.6 % (ref 11.5–15.5)
WBC: 7.9 10*3/uL (ref 4.0–10.5)
nRBC: 0 % (ref 0.0–0.2)

## 2021-11-12 LAB — RESP PANEL BY RT-PCR (FLU A&B, COVID) ARPGX2
Influenza A by PCR: NEGATIVE
Influenza B by PCR: NEGATIVE
SARS Coronavirus 2 by RT PCR: NEGATIVE

## 2021-11-12 MED ORDER — DOCUSATE SODIUM 100 MG PO CAPS: 100.0000 mg | ORAL_CAPSULE | Freq: Two times a day (BID) | ORAL | Status: DC

## 2021-11-12 MED ORDER — ACETAMINOPHEN 325 MG PO TABS
650.0000 mg | ORAL_TABLET | ORAL | Status: DC | PRN
  Administered 2021-11-13 (×2): 650 mg via ORAL
  Filled 2021-11-12 (×2): qty 2

## 2021-11-12 MED ORDER — FERROUS SULFATE 325 (65 FE) MG PO TABS
325.0000 mg | ORAL_TABLET | Freq: Every day | ORAL | Status: DC
  Filled 2021-11-12: qty 1

## 2021-11-12 MED ORDER — NIFEDIPINE 10 MG PO CAPS
10.0000 mg | ORAL_CAPSULE | Freq: Once | ORAL | Status: AC
  Administered 2021-11-12: 10 mg via ORAL
  Filled 2021-11-12: qty 1

## 2021-11-12 MED ORDER — CEFAZOLIN SODIUM-DEXTROSE 2-4 GM/100ML-% IV SOLN: 2.0000 g | INTRAVENOUS | Status: DC

## 2021-11-12 MED ORDER — LACTATED RINGERS IV SOLN: INTRAVENOUS | Status: DC

## 2021-11-12 MED ORDER — BETAMETHASONE SOD PHOS & ACET 6 (3-3) MG/ML IJ SUSP
12.0000 mg | INTRAMUSCULAR | Status: AC
  Administered 2021-11-12 – 2021-11-13 (×2): 12 mg via INTRAMUSCULAR
  Filled 2021-11-12: qty 5

## 2021-11-12 MED ORDER — DOCUSATE SODIUM 100 MG PO CAPS: 100.0000 mg | ORAL_CAPSULE | Freq: Every day | ORAL | Status: DC

## 2021-11-12 MED ORDER — NIFEDIPINE 10 MG PO CAPS
10.0000 mg | ORAL_CAPSULE | Freq: Three times a day (TID) | ORAL | Status: DC
  Administered 2021-11-12 – 2021-11-14 (×4): 10 mg via ORAL
  Filled 2021-11-12 (×5): qty 1

## 2021-11-12 MED ORDER — SOD CITRATE-CITRIC ACID 500-334 MG/5ML PO SOLN: 30.0000 mL | Freq: Once | ORAL | Status: DC

## 2021-11-12 MED ORDER — PRENATAL MULTIVITAMIN CH
1.0000 | ORAL_TABLET | Freq: Every day | ORAL | Status: DC
  Administered 2021-11-13: 18:00:00 1 via ORAL
  Filled 2021-11-12: qty 1

## 2021-11-12 MED ORDER — FAMOTIDINE IN NACL 20-0.9 MG/50ML-% IV SOLN: 20.0000 mg | Freq: Once | INTRAVENOUS | Status: DC

## 2021-11-12 MED ORDER — CALCIUM CARBONATE ANTACID 500 MG PO CHEW: 2.0000 | CHEWABLE_TABLET | ORAL | Status: DC | PRN

## 2021-11-12 MED ORDER — LACTATED RINGERS IV BOLUS: 1000.0000 mL | Freq: Once | INTRAVENOUS | Status: DC

## 2021-11-12 NOTE — MAU Provider Note (Signed)
History     CSN: [id number]  Arrival date and time: 11/12/21 1833   None     Chief Complaint  Patient presents with   Rupture of Membranes   HPI  [name] is a 40 y.o. female G2P1001 @ 66w6dhere with PPROM at 1800.  She desires a repeat C/S. HX of A1GDM. No vaginal bleeding. + fetal movement. Occasional contraction.   Patient leaking fluid on the floor in triage room.   OB History     Gravida  2   Para  1   Term  1   Preterm  0   AB  0   Living  1      SAB  0   IAB  0   Ectopic  0   Multiple  0   Live Births  1           Past Medical History:  Diagnosis Date   Anxiety    claustrophobic   Asthma    childhood   Breech presentation 06/13/2017   Eczema    Fibroid    GDM, class A1 06/13/2017   Genital warts 2013   tx with aldara generic   Gestational diabetes    IBS (irritable bowel syndrome)    Vaginal Pap smear, abnormal     Past Surgical History:  Procedure Laterality Date   CESAREAN SECTION N/A 06/14/2017   Procedure: Primary CESAREAN SECTION;  Surgeon: MAzucena Fallen MD;  Location[facility]  Service: Obstetrics;  Laterality: N/A;  EDD: 06/20/17 Allergy: Codeine   COLONOSCOPY  08/2015   WISDOM TOOTH EXTRACTION      Family History  Problem Relation Age of Onset   Cancer Maternal Grandmother        great gran- breast   Seizures Maternal Grandmother    Breast cancer Maternal Grandmother    Diabetes Maternal Grandfather    Kidney disease Maternal Grandfather    Hypertension Father    Arthritis Paternal Grandfather     Social History   Tobacco Use   Smoking status: Never   Smokeless tobacco: Never  Substance Use Topics   Alcohol use: Yes    Comment: social   Drug use: No    Allergies:  Allergies  Allergen Reactions   Hydrocodone     Hallucinations    Medications Prior to Admission  Medication Sig Dispense Refill Last Dose   acetaminophen (TYLENOL) 500 MG tablet Take 500-1,000 mg by mouth 2  (two) times daily as needed for mild pain.      Ascorbic Acid (VITAMIN C) 1000 MG tablet Take 1,000 mg by mouth daily.      Blood Glucose Monitoring Suppl (FREESTYLE LITE) w/Device KIT Use as directed. 1 kit 0    cetirizine (ZYRTEC) 10 MG tablet Take 10 mg by mouth daily as needed for allergies.       Cholecalciferol (VITAMIN D) 125 MCG (5000 UT) CAPS Take by mouth.      ELDERBERRY PO Take by mouth.      fluconazole (DIFLUCAN) 150 MG tablet Take 1 tablet by mouth 1 tablet 0    glucose blood (FREESTYLE LITE) test strip Use as directed to test blood glucose levels four times daily. 100 each 2    ibuprofen (ADVIL,MOTRIN) 600 MG tablet Take 1 tablet (600 mg total) by mouth every 6 (six) hours as needed for mild pain. 30 tablet 0    Lancet Devices (LANCING DEVICE) MISC Use as directed 1 each 0  Lancets (FREESTYLE) lancets Use one lancet 4 times daily as directed. 100 each 2    Multiple Vitamins-Minerals (ZINC PO) Take by mouth.      NIFEdipine (PROCARDIA) 10 MG capsule Take 1 capsule by mouth twice a day. 30 capsule 0    terconazole (TERAZOL 7) 0.4 % vaginal cream Insert 1 applicatorful vaginally every day for 7 days. 45 g 0    Review of Systems  Gastrointestinal:  Negative for abdominal pain.  Genitourinary:  Negative for vaginal bleeding and vaginal discharge.  Physical Exam   Blood pressure 135/68, pulse 78, resp. rate 17, last menstrual period 03/13/2021, SpO2 100 %, unknown if currently breastfeeding.  Physical Exam Constitutional:      Appearance: Normal appearance.  Neurological:     Mental Status: She is alert and oriented to person, place, and time.  Psychiatric:        Behavior: Behavior normal.   Fetal Tracing: Baseline: 135 bpm Variability: Moderate  Accelerations: 15x15 Decelerations: None Toco: Occasional   MAU Course  Procedures  MDM  Fern slide positive Reviewed patient with Dr. Benjie Karvonen who is coming to the hospital to see patient Reviewed patient with NICU-  awaiting approval or recommendation to transfer.   Assessment and Plan   A:  History of preterm premature rupture of membranes (PPROM)  [redacted] weeks gestation of pregnancy   P:  Admit to [facility] given    Lezlie Lye, NP 11/12/2021 8:13 PM

## 2021-11-12 NOTE — H&P (Signed)
Ana Davis is a 40 y.o. female presenting for PPROM at 34.6 weeks clear fluid  G2P1001. Well dated by 1st trim sono. EDC 12/18/21 by 1st trim sono AMA. - nl NIPT XY Fibroids,  H/o LTCS at 37 wks for IUGR/ Breech- planning repeat C/s , declines sterilization  Extreme pelvic pain, pressure this pregnancy and hence started medical leave last week, felt like her baby was riping out of her since few weeks.  Preterm contractions off and on, takes Procardia 10 mg po prn with some help.  A1GDM is well controlled  Asthma stable   OB History     Gravida  2   Para  1   Term  1   Preterm  0   AB  0   Living  1      SAB  0   IAB  0   Ectopic  0   Multiple  0   Live Births  1          Past Medical History:  Diagnosis Date   Anxiety    claustrophobic   Asthma    childhood   Breech presentation 06/13/2017   Eczema    Fibroid    GDM, class A1 06/13/2017   Genital warts 2013   tx with aldara generic   Gestational diabetes    IBS (irritable bowel syndrome)    Vaginal Pap smear, abnormal    Past Surgical History:  Procedure Laterality Date   CESAREAN SECTION N/A 06/14/2017   Procedure: Primary CESAREAN SECTION;  Surgeon: Azucena Fallen, MD;  Location: [facility];  Service: Obstetrics;  Laterality: N/A;  EDD: 06/20/17 Allergy: Codeine   COLONOSCOPY  08/2015   WISDOM TOOTH EXTRACTION     Family History: family history includes Arthritis in her paternal grandfather; Breast cancer in her maternal grandmother; Cancer in her maternal grandmother; Diabetes in her maternal grandfather; Hypertension in her father; Kidney disease in her maternal grandfather; Seizures in her maternal grandmother. Social History:  reports that she has never smoked. She has never used smokeless tobacco. She reports that she does not currently use alcohol. She reports that she does not use drugs.     Maternal Diabetes: Yes:  Diabetes Type:  Diet controlled Genetic Screening: Normal  NIPT and AFP1  Maternal Ultrasounds/Referrals: Normal Fetal Ultrasounds or other Referrals:  None Maternal Substance Abuse:  No Significant Maternal Medications:  Meds include: Other: Procardia 10 mg prn in 3rd trim for contractions  Significant Maternal Lab Results:  Rh negative Other Comments:   GBS not done, will check at admission  Review of Systems History   Blood pressure 115/65, pulse 77, resp. rate 17, last menstrual period 03/13/2021, SpO2 100 %, unknown if currently breastfeeding. Exam Physical Exam  A&O x 3, no acute distress. Pleasant HEENT neg, no thyromegaly Lungs CTA bilat CV RRR, S1S2 normal Abdo soft, non tender, non acute. Gravid fibroid uterus  Extr no edema/ tenderness Pelvic per [facility] CNM, grossly PPROM, cx was closed in office on 11/15  FHT  150s mod variability + accels, no decels cat I  Toco rare   Prenatal labs: ABO, Rh:  O neg Antibody:  Neg Rubella:  Imm VZ immune RPR:   NR HBsAg:   Neg HIV:   Neg GBS:   To be done at admission GL[facility] - 3hr abn  NIPT low risk XY AFP1 nl   Assessment/Plan: 40 yo G2P1001 34.6 wks with PPROM. Prior C/s, delivery planned by repeat C/section 1) Admit  to Antepartum, conservative management, plan repeat C/s if labors or changes to [facility]. O/w plan RC/s at 35-36 wks pending NICU bed availability. No tocolysis. Inform NICU. CEFM and tocometry  2) Prematurity- BTMZ x 2 doses  3) A1GDM- BS F, 2hr PP meals  4) Fibroids, Anemia- iron, T&S  5) Asthma - inhaler prn stable  Consent in chart for RC/s .  Risks/complications of surgery reviewed incl infection, bleeding, damage to internal organs including bladder, bowels, ureters, blood vessels, other risks from anesthesia, VTE and delayed complications of any surgery, complications in future surgery reviewed. Also discussed neonatal complications incl difficult delivery, laceration, vacuum assistance, TTN etc. Pt understands and agrees, all concerns addressed.     Elveria Royals 11/12/2021, 7:10 PM

## 2021-11-12 NOTE — MAU Note (Signed)
...  Ana Davis is a 40 y.o. at [redacted]w[redacted]d here in MAU reporting: gush of clear fluids at 1800. +FM. No VB. Repeat C/S. Does not desire TOLAC.  GDM.

## 2021-11-12 NOTE — Progress Notes (Signed)
Spoke w/ couple about NICU being full. Admit here and buy time for BTMZ and plan delivery in 2-3 days vs transfer to another hospital now. Should she go in labor and I need to perform C/section before NICU bed is available, she may stay here and baby may need to transferred if NICU admission needed.  She accepts that and declines transfer today

## 2021-11-13 LAB — GLUCOSE, CAPILLARY
Glucose-Capillary: 220 mg/dL — ABNORMAL HIGH (ref 70–99)
Glucose-Capillary: 141 mg/dL — ABNORMAL HIGH (ref 70–99)
Glucose-Capillary: 134 mg/dL — ABNORMAL HIGH (ref 70–99)
Glucose-Capillary: 123 mg/dL — ABNORMAL HIGH (ref 70–99)
Glucose-Capillary: 180 mg/dL — ABNORMAL HIGH (ref 70–99)
Glucose-Capillary: 146 mg/dL — ABNORMAL HIGH (ref 70–99)

## 2021-11-13 LAB — HEMOGLOBIN A1C
Hgb A1c MFr Bld: 5.3 % (ref 4.8–5.6)
Mean Plasma Glucose: 105.41 mg/dL

## 2021-11-13 LAB — RPR: RPR Ser Ql: NONREACTIVE

## 2021-11-13 MED ORDER — INSULIN ASPART 100 UNIT/ML IJ SOLN
4.0000 [IU] | Freq: Once | INTRAMUSCULAR | Status: AC
  Administered 2021-11-13: 16:00:00 4 [IU] via SUBCUTANEOUS

## 2021-11-13 MED ORDER — INSULIN ASPART 100 UNIT/ML IJ SOLN
0.0000 [IU] | Freq: Three times a day (TID) | INTRAMUSCULAR | Status: DC
  Administered 2021-11-13: 21:00:00 2 [IU] via SUBCUTANEOUS

## 2021-11-13 MED ORDER — INSULIN ASPART 100 UNIT/ML IJ SOLN
4.0000 [IU] | Freq: Once | INTRAMUSCULAR | Status: AC
  Administered 2021-11-13: 08:00:00 4 [IU] via SUBCUTANEOUS

## 2021-11-13 MED ORDER — CYCLOBENZAPRINE HCL 10 MG PO TABS
5.0000 mg | ORAL_TABLET | Freq: Three times a day (TID) | ORAL | Status: DC | PRN
  Administered 2021-11-13: 21:00:00 5 mg via ORAL
  Filled 2021-11-13: qty 1

## 2021-11-13 MED ORDER — FENTANYL CITRATE (PF) 100 MCG/2ML IJ SOLN
50.0000 ug | Freq: Once | INTRAMUSCULAR | Status: AC
  Administered 2021-11-13: 50 ug via INTRAVENOUS
  Filled 2021-11-13: qty 2

## 2021-11-13 MED ORDER — INSULIN ASPART 100 UNIT/ML IJ SOLN: 0.0000 [IU] | Freq: Three times a day (TID) | INTRAMUSCULAR | Status: DC

## 2021-11-13 MED ORDER — LACTATED RINGERS IV BOLUS: 500.0000 mL | Freq: Once | INTRAVENOUS | Status: DC

## 2021-11-13 MED ORDER — INSULIN DETEMIR 100 UNIT/ML ~~LOC~~ SOLN
4.0000 [IU] | Freq: Every day | SUBCUTANEOUS | Status: DC
  Administered 2021-11-13: 10:00:00 4 [IU] via SUBCUTANEOUS
  Filled 2021-11-13 (×2): qty 0.04

## 2021-11-13 NOTE — Progress Notes (Signed)
FHT reviewed one decel, now cat I again

## 2021-11-13 NOTE — Progress Notes (Addendum)
Patient ID: Ana Davis, female   DOB: October 06, 1981, 40 y.o.   MRN: [id number] RN called w/ FHT noting late decels and pt reporting bloody vag dc and having more painful UCs  FHT reviewed, decels noted. Overall still cat I Toco irreg but more frequent  SVE cx closed/ f tip (no prior SVD or labor) / Vx -3  Threatened PTL  Additional Procardia 10 mg since last BP 120s/ 80s  IVF bolus 500 cc LR   Reassess in 1 hr. If PTL or decels continue, proceed with RC/s. Pt advised she'll be 35 wks after midnight and will see if baby can stay w/ her to need transfer to another hospital. She would prefer baby transfer [place] since she lives in [place]  ([place], [place] or [facility])

## 2021-11-13 NOTE — Progress Notes (Signed)
Patient ID: Ana Davis, female   DOB: February 24, 1981, 40 y.o.   MRN: [id number] HD #2  PPROM 35 wks  Cephalic   S: UCs are better, less intense, more spaced out and no more vaginal bleeding. Clear fluid leaking. +FMs. No f/c/n/v. Overnight Procardia 20 mg (10 mg +10mg ) and Fentanyl 50 mcg to rest and break UCs pattern  O: BP (!) 100/59 (BP Location: Right Arm)   Pulse 75   Temp 98 F (36.7 C) (Oral)   Resp 16   Ht 4\' 11"  (1.499 m)   Wt 69.9 kg   LMP 03/13/2021   SpO2 99%   BMI 31.10 kg/m  NAD, rested  Abdo gravid, fibroids, non tender uterus FMs noted  Extr no C/C/E. SCDs in bed  Pelvic deferred   FHT 150s mod variability, + accels, rare variable decels with some being lates but still overall cat I  Toco - spaced out to random and occasion serial UCs q 3-4 min   CBC CBC Latest Ref Rng & Units 11/12/2021    WBC 4.0 - 10.5 K/uL 7.9    Hemoglobin 12.0 - 15.0 g/dL 12.7    Hematocrit 36.0 - 46.0 % 37.9    Platelets 150 - 400 K/uL 225       CBG (last 3)  Recent Labs    11/13/21 0634 11/13/21 0801 11/13/21 1007  GLUCAP 141* 134* 123*     A/P: HD #2 PPROM at 35.0 wks  PPROM - considering 34 + wks, no latency antibiotics, no e/o chorioamnionitis.  Threatened preterm labor- Procardia 20mg  at admission, now 10 mg TID to cover [place], proceed w/ RCs if breaks through and in preterm labor  Prematurity - BTMZ 11/16, 11/17 (later this PM)  A1GDM- worse w/ steroids. Levemir 4 u at 10 am and Novolog 4 u for 141 fasting , continue to monitor, will add sliding scale  H/o LTCS- desires repeat C/s regardless of progress  H/o fibroids, anemia - Iron  AMA Asthma stable   Plan- 48 hr steroid completion tomorrow evening, considering RCs 11/18 or 11/19 if FHT continues to remain stable/ cat I and no preterm labor.  NICU full and will not accept baby, they have advised me to transfer mom. Reviewed possible transfer to another hospital, pt declines, she is [employer] employee and has  relationship with our practice, Is hoping 35 wk baby may not need NICU stay but understands may need transfer if NICU stay needed, in that case, she wants transfer of baby towards [place] since lives there   Will sign out to Ana Davis this afternoon  Ana Mems [name] MD

## 2021-11-13 NOTE — Progress Notes (Signed)
Interval Note  HD#2 admission for PPROM on 11/16  Received call from RN to review FHT. Tracing reviewed and there were 3 late decelerations noted, 1 occurring with a contraction and the other 2 appear to be spontaneous. Wandering baseline between 150-180 at that time with marked variability. Maternal repositioning performed and a 500cc fluid bolus of LR given. No further decels noted for the past 1 hour, category I tracing with baseline settled at 140 bpm, moderate variability and presence of accelerations. Patient reporting upper back spasms but no abdominal pain, cramping, or contractions. Patient afebrile. 2nd BMZ given. Will continue to monitor at this time. Plan for repeat cesarean if showing signs of labor, chorio, or NRFHT.   Jacaria Colburn A Falon Flinchum 11/13/21 9:16 PM

## 2021-11-14 ENCOUNTER — Encounter (HOSPITAL_COMMUNITY)
Admission: AD | Disposition: A | Discharge status: Home / Self Care | Payer: Self-pay | Source: Home / Self Care | Attending: Obstetrics & Gynecology

## 2021-11-14 ENCOUNTER — Encounter (HOSPITAL_COMMUNITY): Payer: Self-pay | Admitting: Obstetrics & Gynecology

## 2021-11-14 ENCOUNTER — Inpatient Hospital Stay (HOSPITAL_COMMUNITY): Payer: 59 | Admitting: Anesthesiology

## 2021-11-14 LAB — CBC
HCT: 30.8 % — ABNORMAL LOW (ref 36.0–46.0)
Hemoglobin: 10.7 g/dL — ABNORMAL LOW (ref 12.0–15.0)
MCH: 32.9 pg (ref 26.0–34.0)
MCHC: 34.7 g/dL (ref 30.0–36.0)
MCV: 94.8 fL (ref 80.0–100.0)
Platelets: 198 10*3/uL (ref 150–400)
RBC: 3.25 MIL/uL — ABNORMAL LOW (ref 3.87–5.11)
RDW: 13.6 % (ref 11.5–15.5)
WBC: 16.7 10*3/uL — ABNORMAL HIGH (ref 4.0–10.5)
nRBC: 0 % (ref 0.0–0.2)

## 2021-11-14 LAB — CULTURE, BETA STREP (GROUP B ONLY)

## 2021-11-14 LAB — RPR: RPR Ser Ql: NONREACTIVE

## 2021-11-14 LAB — GLUCOSE, CAPILLARY
Glucose-Capillary: 140 mg/dL — ABNORMAL HIGH (ref 70–99)
Glucose-Capillary: 123 mg/dL — ABNORMAL HIGH (ref 70–99)

## 2021-11-14 SURGERY — Surgical Case
Anesthesia: Spinal | Wound class: Clean Contaminated

## 2021-11-14 MED ORDER — TETANUS-DIPHTH-ACELL PERTUSSIS 5-2.5-18.5 LF-MCG/0.5 IM SUSY: 0.5000 mL | PREFILLED_SYRINGE | Freq: Once | INTRAMUSCULAR | Status: DC

## 2021-11-14 MED ORDER — FENTANYL CITRATE (PF) 100 MCG/2ML IJ SOLN
INTRAMUSCULAR | Status: AC
  Filled 2021-11-14: qty 2

## 2021-11-14 MED ORDER — SIMETHICONE 80 MG PO CHEW: 80.0000 mg | CHEWABLE_TABLET | ORAL | Status: DC | PRN

## 2021-11-14 MED ORDER — ONDANSETRON HCL 4 MG/2ML IJ SOLN: 4.0000 mg | Freq: Three times a day (TID) | INTRAMUSCULAR | Status: DC | PRN

## 2021-11-14 MED ORDER — LACTATED RINGERS IV SOLN: INTRAVENOUS | Status: DC

## 2021-11-14 MED ORDER — POVIDONE-IODINE 10 % EX SWAB
2.0000 "application " | Freq: Once | CUTANEOUS | Status: AC
  Administered 2021-11-14: 2 via TOPICAL

## 2021-11-14 MED ORDER — CEFAZOLIN SODIUM-DEXTROSE 2-4 GM/100ML-% IV SOLN
INTRAVENOUS | Status: AC
  Filled 2021-11-14: qty 100

## 2021-11-14 MED ORDER — FENTANYL CITRATE (PF) 100 MCG/2ML IJ SOLN
100.0000 ug | Freq: Once | INTRAMUSCULAR | Status: AC
  Administered 2021-11-14: 100 ug via INTRAVENOUS
  Filled 2021-11-14: qty 2

## 2021-11-14 MED ORDER — DIPHENHYDRAMINE HCL 25 MG PO CAPS: 25.0000 mg | ORAL_CAPSULE | Freq: Four times a day (QID) | ORAL | Status: DC | PRN

## 2021-11-14 MED ORDER — ZOLPIDEM TARTRATE 5 MG PO TABS: 5.0000 mg | ORAL_TABLET | Freq: Every evening | ORAL | Status: DC | PRN

## 2021-11-14 MED ORDER — OXYTOCIN-SODIUM CHLORIDE 30-0.9 UT/500ML-% IV SOLN
INTRAVENOUS | Status: DC | PRN
  Administered 2021-11-14: 300 mL via INTRAVENOUS

## 2021-11-14 MED ORDER — NALOXONE HCL 0.4 MG/ML IJ SOLN: 0.4000 mg | INTRAMUSCULAR | Status: DC | PRN

## 2021-11-14 MED ORDER — NALOXONE HCL 4 MG/10ML IJ SOLN
1.0000 ug/kg/h | INTRAVENOUS | Status: DC | PRN
  Filled 2021-11-14: qty 5

## 2021-11-14 MED ORDER — SOD CITRATE-CITRIC ACID 500-334 MG/5ML PO SOLN
ORAL | Status: AC
  Administered 2021-11-14: 30 mL
  Filled 2021-11-14: qty 30

## 2021-11-14 MED ORDER — NALBUPHINE HCL 10 MG/ML IJ SOLN: 5.0000 mg | INTRAMUSCULAR | Status: DC | PRN

## 2021-11-14 MED ORDER — KETOROLAC TROMETHAMINE 30 MG/ML IJ SOLN
INTRAMUSCULAR | Status: AC
  Filled 2021-11-14: qty 1

## 2021-11-14 MED ORDER — LACTATED RINGERS IV SOLN: INTRAVENOUS | Status: DC | PRN

## 2021-11-14 MED ORDER — ACETAMINOPHEN 160 MG/5ML PO SOLN: 1000.0000 mg | Freq: Once | ORAL | Status: DC

## 2021-11-14 MED ORDER — SODIUM CHLORIDE 0.9 % IR SOLN
Status: DC | PRN
  Administered 2021-11-14: 1000 mL

## 2021-11-14 MED ORDER — ACETAMINOPHEN 325 MG PO TABS
650.0000 mg | ORAL_TABLET | ORAL | Status: DC | PRN
  Administered 2021-11-15: 650 mg via ORAL
  Filled 2021-11-14: qty 2

## 2021-11-14 MED ORDER — ACETAMINOPHEN 500 MG PO TABS
1000.0000 mg | ORAL_TABLET | Freq: Four times a day (QID) | ORAL | Status: AC
  Administered 2021-11-14 – 2021-11-15 (×3): 1000 mg via ORAL
  Filled 2021-11-14 (×3): qty 2

## 2021-11-14 MED ORDER — NALBUPHINE HCL 10 MG/ML IJ SOLN: 5.0000 mg | Freq: Once | INTRAMUSCULAR | Status: DC | PRN

## 2021-11-14 MED ORDER — ACETAMINOPHEN 500 MG PO TABS: 1000.0000 mg | ORAL_TABLET | Freq: Once | ORAL | Status: DC

## 2021-11-14 MED ORDER — ALBUMIN HUMAN 5 % IV SOLN
INTRAVENOUS | Status: AC
  Filled 2021-11-14: qty 250

## 2021-11-14 MED ORDER — KETOROLAC TROMETHAMINE 30 MG/ML IJ SOLN
30.0000 mg | Freq: Four times a day (QID) | INTRAMUSCULAR | Status: AC
  Administered 2021-11-14 – 2021-11-15 (×3): 30 mg via INTRAVENOUS
  Filled 2021-11-14 (×3): qty 1

## 2021-11-14 MED ORDER — ONDANSETRON HCL 4 MG/2ML IJ SOLN
INTRAMUSCULAR | Status: AC
  Filled 2021-11-14: qty 2

## 2021-11-14 MED ORDER — SENNOSIDES-DOCUSATE SODIUM 8.6-50 MG PO TABS
2.0000 | ORAL_TABLET | Freq: Every day | ORAL | Status: DC
  Administered 2021-11-15 – 2021-11-17 (×3): 2 via ORAL
  Filled 2021-11-14 (×3): qty 2

## 2021-11-14 MED ORDER — WITCH HAZEL-GLYCERIN EX PADS: 1.0000 "application " | MEDICATED_PAD | CUTANEOUS | Status: DC | PRN

## 2021-11-14 MED ORDER — DIPHENHYDRAMINE HCL 50 MG/ML IJ SOLN: 12.5000 mg | Freq: Four times a day (QID) | INTRAMUSCULAR | Status: DC | PRN

## 2021-11-14 MED ORDER — FENTANYL CITRATE (PF) 100 MCG/2ML IJ SOLN
INTRAMUSCULAR | Status: DC | PRN
  Administered 2021-11-14: 15 ug via INTRATHECAL

## 2021-11-14 MED ORDER — IBUPROFEN 600 MG PO TABS
600.0000 mg | ORAL_TABLET | Freq: Four times a day (QID) | ORAL | Status: DC
  Administered 2021-11-15 – 2021-11-17 (×9): 600 mg via ORAL
  Filled 2021-11-14 (×9): qty 1

## 2021-11-14 MED ORDER — MORPHINE SULFATE (PF) 0.5 MG/ML IJ SOLN
INTRAMUSCULAR | Status: AC
  Filled 2021-11-14: qty 10

## 2021-11-14 MED ORDER — PRENATAL MULTIVITAMIN CH
1.0000 | ORAL_TABLET | Freq: Every day | ORAL | Status: DC
  Administered 2021-11-15 – 2021-11-17 (×3): 1 via ORAL
  Filled 2021-11-14 (×3): qty 1

## 2021-11-14 MED ORDER — COCONUT OIL OIL
1.0000 "application " | TOPICAL_OIL | Status: DC | PRN
  Administered 2021-11-16: 1 via TOPICAL

## 2021-11-14 MED ORDER — HYDROMORPHONE HCL 1 MG/ML IJ SOLN: 0.2000 mg | INTRAMUSCULAR | Status: DC | PRN

## 2021-11-14 MED ORDER — MORPHINE SULFATE (PF) 0.5 MG/ML IJ SOLN
INTRAMUSCULAR | Status: DC | PRN
  Administered 2021-11-14: 150 ug via INTRATHECAL

## 2021-11-14 MED ORDER — OXYCODONE HCL 5 MG PO TABS
5.0000 mg | ORAL_TABLET | ORAL | Status: DC | PRN
  Administered 2021-11-16 – 2021-11-17 (×3): 5 mg via ORAL
  Filled 2021-11-14 (×3): qty 1

## 2021-11-14 MED ORDER — PHENYLEPHRINE HCL-NACL 20-0.9 MG/250ML-% IV SOLN
INTRAVENOUS | Status: AC
  Filled 2021-11-14: qty 250

## 2021-11-14 MED ORDER — PHENYLEPHRINE HCL-NACL 20-0.9 MG/250ML-% IV SOLN
INTRAVENOUS | Status: DC | PRN
  Administered 2021-11-14: 60 ug/min via INTRAVENOUS

## 2021-11-14 MED ORDER — PROMETHAZINE HCL 25 MG/ML IJ SOLN: 6.2500 mg | INTRAMUSCULAR | Status: DC | PRN

## 2021-11-14 MED ORDER — ONDANSETRON HCL 4 MG/2ML IJ SOLN
4.0000 mg | Freq: Once | INTRAMUSCULAR | Status: AC
  Administered 2021-11-14: 4 mg via INTRAVENOUS
  Filled 2021-11-14: qty 2

## 2021-11-14 MED ORDER — STERILE WATER FOR IRRIGATION IR SOLN
Status: DC | PRN
  Administered 2021-11-14: 1000 mL

## 2021-11-14 MED ORDER — FENTANYL CITRATE (PF) 100 MCG/2ML IJ SOLN: 25.0000 ug | INTRAMUSCULAR | Status: DC | PRN

## 2021-11-14 MED ORDER — SIMETHICONE 80 MG PO CHEW
80.0000 mg | CHEWABLE_TABLET | Freq: Three times a day (TID) | ORAL | Status: DC
  Administered 2021-11-14 – 2021-11-17 (×7): 80 mg via ORAL
  Filled 2021-11-14 (×7): qty 1

## 2021-11-14 MED ORDER — KETOROLAC TROMETHAMINE 30 MG/ML IJ SOLN: 30.0000 mg | Freq: Four times a day (QID) | INTRAMUSCULAR | Status: DC | PRN

## 2021-11-14 MED ORDER — OXYTOCIN-SODIUM CHLORIDE 30-0.9 UT/500ML-% IV SOLN: 2.5000 [IU]/h | INTRAVENOUS | Status: AC

## 2021-11-14 MED ORDER — LACTATED RINGERS IV BOLUS: 1000.0000 mL | Freq: Once | INTRAVENOUS | Status: DC

## 2021-11-14 MED ORDER — ONDANSETRON HCL 4 MG/2ML IJ SOLN
INTRAMUSCULAR | Status: DC | PRN
  Administered 2021-11-14: 4 mg via INTRAVENOUS

## 2021-11-14 MED ORDER — ALBUMIN HUMAN 5 % IV SOLN
12.5000 g | Freq: Once | INTRAVENOUS | Status: AC
  Administered 2021-11-14: 12.5 g via INTRAVENOUS
  Filled 2021-11-14: qty 250

## 2021-11-14 MED ORDER — SODIUM CHLORIDE 0.9% FLUSH: 3.0000 mL | INTRAVENOUS | Status: DC | PRN

## 2021-11-14 MED ORDER — MENTHOL 3 MG MT LOZG: 1.0000 | LOZENGE | OROMUCOSAL | Status: DC | PRN

## 2021-11-14 MED ORDER — CEFAZOLIN SODIUM-DEXTROSE 2-3 GM-%(50ML) IV SOLR
INTRAVENOUS | Status: DC | PRN
  Administered 2021-11-14: 2 g via INTRAVENOUS

## 2021-11-14 MED ORDER — KETOROLAC TROMETHAMINE 30 MG/ML IJ SOLN
30.0000 mg | Freq: Once | INTRAMUSCULAR | Status: AC
  Administered 2021-11-14: 30 mg via INTRAVENOUS

## 2021-11-14 MED ORDER — DIBUCAINE (PERIANAL) 1 % EX OINT: 1.0000 "application " | TOPICAL_OINTMENT | CUTANEOUS | Status: DC | PRN

## 2021-11-14 MED ORDER — BUPIVACAINE IN DEXTROSE 0.75-8.25 % IT SOLN
INTRATHECAL | Status: DC | PRN
  Administered 2021-11-14: 1.6 mL via INTRATHECAL

## 2021-11-14 SURGICAL SUPPLY — 37 items
BENZOIN TINCTURE PRP APPL 2/3 (GAUZE/BANDAGES/DRESSINGS) ×2 IMPLANT
CHLORAPREP W/TINT 26ML (MISCELLANEOUS) ×2 IMPLANT
CLAMP CORD UMBIL (MISCELLANEOUS) IMPLANT
CLOTH BEACON ORANGE TIMEOUT ST (SAFETY) ×2 IMPLANT
DRAPE C SECTION CLR SCREEN (DRAPES) ×2 IMPLANT
DRSG OPSITE POSTOP 4X10 (GAUZE/BANDAGES/DRESSINGS) ×2 IMPLANT
ELECT REM PT RETURN 9FT ADLT (ELECTROSURGICAL) ×2
ELECTRODE REM PT RTRN 9FT ADLT (ELECTROSURGICAL) ×1 IMPLANT
EXTRACTOR VACUUM KIWI (MISCELLANEOUS) IMPLANT
EXTRACTOR VACUUM M CUP 4 TUBE (SUCTIONS) IMPLANT
GAUZE SPONGE 4X4 12PLY STRL LF (GAUZE/BANDAGES/DRESSINGS) ×4 IMPLANT
GLOVE BIO SURGEON STRL SZ7 (GLOVE) ×2 IMPLANT
GLOVE BIOGEL PI IND STRL 7.0 (GLOVE) ×2 IMPLANT
GLOVE BIOGEL PI INDICATOR 7.0 (GLOVE) ×2
GOWN STRL REUS W/TWL LRG LVL3 (GOWN DISPOSABLE) ×4 IMPLANT
KIT ABG SYR 3ML LUER SLIP (SYRINGE) IMPLANT
NEEDLE HYPO 25X5/8 SAFETYGLIDE (NEEDLE) IMPLANT
NS IRRIG 1000ML POUR BTL (IV SOLUTION) ×2 IMPLANT
PACK C SECTION WH (CUSTOM PROCEDURE TRAY) ×2 IMPLANT
PAD ABD 7.5X8 STRL (GAUZE/BANDAGES/DRESSINGS) ×2 IMPLANT
PAD OB MATERNITY 4.3X12.25 (PERSONAL CARE ITEMS) ×2 IMPLANT
RTRCTR C-SECT PINK 25CM LRG (MISCELLANEOUS) IMPLANT
STRIP CLOSURE SKIN 1/2X4 (GAUZE/BANDAGES/DRESSINGS) ×2 IMPLANT
SUT MNCRL 0 VIOLET CTX 36 (SUTURE) ×3 IMPLANT
SUT MONOCRYL 0 CTX 36 (SUTURE) ×3
SUT PLAIN 0 NONE (SUTURE) IMPLANT
SUT PLAIN 2 0 (SUTURE)
SUT PLAIN ABS 2-0 CT1 27XMFL (SUTURE) IMPLANT
SUT VIC AB 0 CT1 27 (SUTURE) ×2
SUT VIC AB 0 CT1 27XBRD ANBCTR (SUTURE) ×2 IMPLANT
SUT VIC AB 2-0 CT1 27 (SUTURE) ×1
SUT VIC AB 2-0 CT1 TAPERPNT 27 (SUTURE) ×1 IMPLANT
SUT VIC AB 4-0 KS 27 (SUTURE) ×2 IMPLANT
SUT VICRYL 0 TIES 12 18 (SUTURE) IMPLANT
TOWEL OR 17X24 6PK STRL BLUE (TOWEL DISPOSABLE) ×2 IMPLANT
TRAY FOLEY W/BAG SLVR 14FR LF (SET/KITS/TRAYS/PACK) IMPLANT
WATER STERILE IRR 1000ML POUR (IV SOLUTION) ×2 IMPLANT

## 2021-11-14 NOTE — Anesthesia Procedure Notes (Signed)
Spinal  Patient location during procedure: OR Start time: 11/14/2021 7:18 AM End time: 11/14/2021 7:22 AM Reason for block: surgical anesthesia Staffing Performed: anesthesiologist  Anesthesiologist: Freddrick March, MD Preanesthetic Checklist Completed: patient identified, IV checked, risks and benefits discussed, surgical consent, monitors and equipment checked, pre-op evaluation and timeout performed Spinal Block Patient position: sitting Prep: DuraPrep and site prepped and draped Patient monitoring: cardiac monitor, continuous pulse ox and blood pressure Approach: midline Location: L3-4 Injection technique: single-shot Needle Needle type: Pencan  Needle gauge: 24 G Needle length: 9 cm Assessment Sensory level: T6 Events: CSF return Additional Notes Functioning IV was confirmed and monitors were applied. Sterile prep and drape, including hand hygiene and sterile gloves were used. The patient was positioned and the spine was prepped. The skin was anesthetized with lidocaine.  Free flow of clear CSF was obtained prior to injecting local anesthetic into the CSF.  The spinal needle aspirated freely following injection.  The needle was carefully withdrawn.  The patient tolerated the procedure well.

## 2021-11-14 NOTE — Lactation Note (Signed)
This note was copied from a baby's chart. Lactation Consultation Note  Patient Name: Ana Davis [id number] Date: 11/14/2021 Reason for consult: Initial assessment;Late-preterm 34-36.6wks (Nipple edema, C/S delivery, les than 6 lbs.  Infant is being supplemented with 22kcal donor breast milk.) Age:41 hours Parents current feeding plan is breast feeding and supplementing with donor breast milk. LC entered the room, dad was holding infant swaddled in clothing. Mom attempted to latch infant on her right breast infant did not sustain latch, mostly licked and taste, LC explained this is not uncommon behavior for LPTI. After 5 minutes of attempting to latch infant at the breast LC asked mom to stop to reserve infant's energy. Infant was given 10 mls of donor breast milk using slow flow ( green bottle nipple) with chin support and being bottle fed in side-lying position, infant did not form good ceil, some of donor milk was dribbling out  the side of infant's mouth. Infant could benefit from SPL due to immaturity of latch, uncoordinated suck and tongue thrusting with bottle nipple. Mom understand infant feeding plan may change based on infant's feeding behavior. Mom was using DEBP as LC left the room. Mom shown how to use DEBP & how to disassemble, clean, & reassemble parts.  Mom made aware of O/P services, breastfeeding support groups, community resources, and our phone # for post-discharge questions.   Mom's current feeding plan: 1- Mom will follow LPTI feeding policy ( green sheet given) and if infant doesn't latch after 5 minutes with attempts, mom will stop to reserve infant's energy. 2- Mom knows to call RN/LC if she has breastfeeding questions, concerns or needs further assistance with latching infant at the breast. 3-Mom will supplement infant every feeding with 22 kcal donor breast milk 10 mls or more if infant wants. 4- Mom will pump every 3 hours for 15 minutes on initial setting,  any EBM that is pumped in flanges mom will finger feed to infant.  Maternal Data Has patient been taught Hand Expression?: Yes Does the patient have breastfeeding experience prior to this delivery?: Yes How long did the patient breastfeed?: Per mom, her 1st child was ETI and she breastfed him for 4 months.  Feeding Mother's Current Feeding Choice: Breast Milk and Donor Milk  LATCH Score Latch: Too sleepy or reluctant, no latch achieved, no sucking elicited. (LC noticed little nipple edema in both breast.)  Audible Swallowing: None  Type of Nipple: Everted at rest and after stimulation  Comfort (Breast/Nipple): Soft / non-tender  Hold (Positioning): Assistance needed to correctly position infant at breast and maintain latch.  LATCH Score: 5   Lactation Tools Discussed/Used Tools: Pump Breast pump type: Double-Electric Breast Pump Pump Education: Setup, frequency, and cleaning;Milk Storage Reason for Pumping: LPTI, immature suck, not latching well at breast, less than 6 lbs. Pumping frequency: Mom will pump every 3 hours for 15 minutes on inital setting.  Interventions Interventions: Breast feeding basics reviewed;Skin to skin;Breast compression;Adjust position;Support pillows;Position options;Pace feeding;Education;LC Services brochure;DEBP;Hand pump;Assisted with latch  Discharge Pump: DEBP;Manual;Employee Pump (Per mom, she is ordering her DEBP with her [place] she doesn't want a Medela, plan to order a [employer].) WIC Program: No  Consult Status Consult Status: Follow-up Date: 11/15/21 Follow-up type: In-patient    Vicente Serene 11/14/2021, 5:31 PM

## 2021-11-14 NOTE — Transfer of Care (Signed)
Immediate Anesthesia Transfer of Care Note  Patient: Ana Davis  Procedure(s) Performed: Repeat CESAREAN SECTION  Patient Location: PACU  Anesthesia Type:Spinal  Level of Consciousness: awake  Airway & Oxygen Therapy: Patient Spontanous Breathing  Post-op Assessment: Report given to RN and Post -op Vital signs reviewed and stable  Post vital signs: Reviewed and stable  Last Vitals:  Vitals Value Taken Time  BP 107/63 11/14/21 0846  Temp    Pulse 73 11/14/21 0849  Resp 23 11/14/21 0849  SpO2 98 % 11/14/21 0849  Vitals shown include unvalidated device data.  Last Pain:  Vitals:   11/14/21 0553  TempSrc: Oral  PainSc:          Complications: No notable events documented.

## 2021-11-14 NOTE — Progress Notes (Signed)
Per Dr. Lanetta Inch, okay for patient to be transferred to mother baby.

## 2021-11-14 NOTE — Op Note (Signed)
Ana Davis 20-Feb-1981 [id number]  OPERATIVE NOTE  PROCEDURE: repeat low transverse cesarean section  PRE-OPERATIVE DIAGNOSIS:  Single intrauterine pregnancy at 35 weeks 1 days Preterm premature rupture of membranes Preterm labor Non-reassuring fetal heart tracing History of previous cesarean section, desires repeat  POST-OPERATIVE DIAGNOSIS: Single intrauterine pregnancy at 35 weeks 1 days, delivered Preterm premature rupture of membranes Preterm labor Non-reassuring fetal heart tracing History of previous cesarean section, desires repeat  SURGEON: Langley Gauss, DO  ASSISTANT: Derrell Lolling, CNM  FINDINGS: viable healthy female infant in the vertex presentation apgars 9 and 9 at the 1 and 5 minutes respectively weight 5 pounds 7.8 ounces. Normal female gravid anatomy including uterus with normal bilateral fallopian tubes and ovaries. No significant adhesions however very thin subcutaneous layer with thin fascia noted  EBL: 428 cc  FLUIDS: 1,500 cc crystalloids  MEDICATIONS: Ancef 2g  URINE OUTPUT: 902 cc clear  COMPLICATIONS: none  PROCEDURE IN DETAIL:  After the patient was appropriately consented she was taken to the operating room where regional anesthesia was obtained without complications. The patient was placed in the dorsal supine position with leftward tilt. Fetal heart tones were obtained and found to be reassuring. A Foley catheter was placed and the bladder was drained for clear yellow urine and remained in place for the duration of the procedure. The patient was prepped and draped in the usual sterile fashion. An appropriate time out was performed that verified the correct patient, procedure, and surgical team.   The scalpel was used to make a low transverse skin incision. The incision was carried down to the fascia, maintaining hemostasis with the Bovie as needed. Of note, the subcutaneous layer was noted to be very thin and the fascia was quickly reached  and noted to be thin. The fascia was incised to the left and the right of the midline. The fascia incision was carried laterally using the curved Mayo scissors on either side. The inferior aspect of the fascia was grasped with the Kocher clamps, tented upwards, and the underlying rectus muscle dissected off bluntly and sharply with curved Mayo scissors. The Kocher clamps were removed, placed on the superior aspect of the fascia and the rectus muscles dissected off in a similar fashion. The rectus muscles were divided at the midline bluntly. The peritoneum was identified, grasped with hemostat clamps and entered sharply with the Metzenbaum scissors. The incision was then extended laterally by bluntly stretching. The bladder blade was inserted. The vesicouterine peritoneum was dissected off the lower uterine segment and a bladder flap was created digitally. The scalpel was used to make a low transverse uterine incision. The incision was extended caudad and cephalad by bluntly stretching. The amniotic membranes were ruptured for scant clear fluid with slight odor noted. The infant was found to be in the cephalic presentation. The infant's head was delivered through the hysterotomy while maintaining adequate flexion at the neck. The infant's remaining shoulders and body were subsequently delivered without difficulties. The infant was dried, stimulated, and handed off to the awaiting neonatal team after 60 seconds of delayed cord clamping. The placenta was delivered spontaneously intact by gentle traction. The uterine cavity was cleared of any clot and debris. The hysterotomy was closed using 0-Monocryl in a running locking fashion. A second layer closure was performed using the same suture. Some additional bleeding noted in the left lateral aspect of the incision and additional sutures placed as needed for hemostasis.Topical Arista was placed for generalized oozing at the hysterotomy. Adequate hemostasis  of the  hysterotomy was then noted. The bilateral gutters were cleared of all clot and debris. Bilateral tubes and ovaries were inspected and found to be within normal limits. The underlying fascia planes were inspected and found to be hemostatic. The fascia was closed with 0-Vicryl a normal running fashion. The subcutaneous layer was noted to be thin and hemostatic, no sutures placed. The skin layer was closed with 4-0 Vicryl. The patient tolerated the procedure well and was taken to the recovery room in stable condition. All instrument, needle, and sponge counts were correct.   Ana Davis 11/14/21 9:10 AM

## 2021-11-14 NOTE — Progress Notes (Signed)
CBG 123 

## 2021-11-14 NOTE — Progress Notes (Signed)
Interval Note  HD#3 admission for PPROM 11/16  Patient started feeling stronger contractions around 3AM initially every 10 minutes that woke her from sleep. Initially able to talk through the contractions. Since then contractions have gotten stronger, requesting pain medication, and occurring every 6 minutes now. FHT has been overall reassuring but having intermittent late decelerations with more frequent contractions. Given PPROM at >34w with signs of labor and FHT with decelerations will proceed with repeat cesarean section at this time. Patient has been consented for cesarean. She is aware of risks of bleeding, infection, and damage to surrounding structures. She is agreeable to blood transfusion if indicated. She declines tubal sterilization. The OR team has been notified. NICU has been notified. Plan for Ancef 2g and SCDs. Routine preop care.  Dayle Mcnerney A Shaquelle Hernon 11/14/21 6:29 AM

## 2021-11-14 NOTE — Progress Notes (Signed)
[name] was referred for history of anxiety. * Referral screened out by Clinical Social Worker because none of the following criteria appear to apply: ~ History of anxiety/depression during this pregnancy, or of post-partum depression following prior delivery. No concerns noted in the prenatal records. ~ Diagnosis of anxiety and/or depression within last 3 years. Per chart review, [name]'s anxiety dates back to 2018.  OR * [name]'s symptoms currently being treated with medication and/or therapy. Please contact the Clinical Social Worker if needs arise, by [facility] request, or if [name] scores greater than 9/yes to question 10 on Edinburgh Postpartum Depression Screen.  Ana Davis, [place] Worker [facility] Cell#: [phone]

## 2021-11-14 NOTE — Anesthesia Postprocedure Evaluation (Signed)
Anesthesia Post Note  Patient: Ana Davis  Procedure(s) Performed: Repeat CESAREAN SECTION     Patient location during evaluation: PACU Anesthesia Type: Spinal Level of consciousness: oriented and awake and alert Pain management: pain level controlled Vital Signs Assessment: post-procedure vital signs reviewed and stable Respiratory status: spontaneous breathing, respiratory function stable and patient connected to nasal cannula oxygen Cardiovascular status: blood pressure returned to baseline and stable Postop Assessment: no headache, no backache and no apparent nausea or vomiting Anesthetic complications: no   No notable events documented.  Last Vitals:  Vitals:   11/14/21 1230 11/14/21 1245  BP: (!) 89/47 (!) 92/47  Pulse: 66 68  Resp: 15 15  Temp:    SpO2: 95% 95%    Last Pain:  Vitals:   11/14/21 1215  TempSrc:   PainSc: Asleep   Pain Goal:                Epidural/Spinal Function Cutaneous sensation: Able to Wiggle Toes (11/14/21 1245), Patient able to flex knees: Yes (11/14/21 1245), Patient able to lift hips off bed: No (11/14/21 1245), Back pain beyond tenderness at insertion site: No (11/14/21 1245), Progressively worsening motor and/or sensory loss: No (11/14/21 1245), Bowel and/or bladder incontinence post epidural: No (11/14/21 1245)  [place]

## 2021-11-14 NOTE — Anesthesia Preprocedure Evaluation (Signed)
Anesthesia Evaluation  Patient identified by MRN, date of birth, ID band Patient awake    Reviewed: Allergy & Precautions, NPO status , Patient's Chart, lab work & pertinent test results  History of Anesthesia Complications Negative for: history of anesthetic complications  Airway Mallampati: II  TM Distance: >3 FB Neck ROM: Full    Dental no notable dental hx.    Pulmonary asthma ,    Pulmonary exam normal        Cardiovascular negative cardio ROS Normal cardiovascular exam     Neuro/Psych Anxiety negative neurological ROS     GI/Hepatic negative GI ROS, Neg liver ROS,   Endo/Other  diabetes, Gestational  Renal/GU negative Renal ROS  negative genitourinary   Musculoskeletal negative musculoskeletal ROS (+)   Abdominal   Peds  Hematology negative hematology ROS (+)   Anesthesia Other Findings Day of surgery medications reviewed with patient.  Reproductive/Obstetrics (+) Pregnancy (Hx of C/S x1)                             Anesthesia Physical Anesthesia Plan  ASA: 3 and emergent  Anesthesia Plan: Spinal   Post-op Pain Management:    Induction:   PONV Risk Score and Plan: 4 or greater and Treatment may vary due to age or medical condition, Ondansetron and Dexamethasone  Airway Management Planned: Natural Airway  Additional Equipment: None  Intra-op Plan:   Post-operative Plan:   Informed Consent: I have reviewed the patients History and Physical, chart, labs and discussed the procedure including the risks, benefits and alternatives for the proposed anesthesia with the patient or authorized representative who has indicated his/her understanding and acceptance.       Plan Discussed with: CRNA  Anesthesia Plan Comments:         Anesthesia Quick Evaluation

## 2021-11-14 NOTE — Brief Op Note (Signed)
11/12/2021 - 11/14/2021  8:31 AM  PATIENT:  Ana Davis  40 y.o. female  PRE-OPERATIVE DIAGNOSIS:  Premature rupture of membranes 34.6 weeks, Previous Cesarean Section, A1GDM, fibroids, advanced maternal age, asthma  POST-OPERATIVE DIAGNOSIS:  Premature rupture of membranes 34.6 weeks, Previous Cesarean Section, A1GDM, fibroids, advanced maternal age, asthma  PROCEDURE:  Procedure(s) with comments: Repeat CESAREAN SECTION (N/A) - EDD: 12/18/21  SURGEON:  Surgeon(s) and Role:    * Romana Deaton A, DO - Primary  PHYSICIAN ASSISTANT:   ASSISTANTS: Derrell Lolling, CNM   ANESTHESIA:   spinal  EBL:  428 cc  BLOOD ADMINISTERED:none  DRAINS: Urinary Catheter (Foley)   LOCAL MEDICATIONS USED:  NONE  SPECIMEN:  No Specimen- PLACENTA  DISPOSITION OF SPECIMEN:  PATHOLOGY  COUNTS:  YES  TOURNIQUET:  * No tourniquets in log *  DICTATION: .Note written in EPIC-Pending  PLAN OF CARE: Admit to inpatient   PATIENT DISPOSITION:  PACU - hemodynamically stable.   Delay start of Pharmacological VTE agent (>24hrs) due to surgical blood loss or risk of bleeding: no

## 2021-11-15 ENCOUNTER — Encounter (HOSPITAL_COMMUNITY): Payer: Self-pay | Admitting: Obstetrics & Gynecology

## 2021-11-15 LAB — CBC
HCT: 18.6 % — ABNORMAL LOW (ref 36.0–46.0)
Hemoglobin: 6.1 g/dL — CL (ref 12.0–15.0)
MCH: 32.3 pg (ref 26.0–34.0)
MCHC: 32.8 g/dL (ref 30.0–36.0)
MCV: 98.4 fL (ref 80.0–100.0)
Platelets: 135 10*3/uL — ABNORMAL LOW (ref 150–400)
RBC: 1.89 MIL/uL — ABNORMAL LOW (ref 3.87–5.11)
RDW: 13.7 % (ref 11.5–15.5)
WBC: 13.8 10*3/uL — ABNORMAL HIGH (ref 4.0–10.5)
nRBC: 0 % (ref 0.0–0.2)

## 2021-11-15 LAB — PREPARE RBC (CROSSMATCH)

## 2021-11-15 LAB — CBC WITH DIFFERENTIAL/PLATELET
Abs Immature Granulocytes: 0.26 10*3/uL — ABNORMAL HIGH (ref 0.00–0.07)
Basophils Absolute: 0 10*3/uL (ref 0.0–0.1)
Basophils Relative: 0 %
Eosinophils Absolute: 0 10*3/uL (ref 0.0–0.5)
Eosinophils Relative: 0 %
HCT: 21.7 % — ABNORMAL LOW (ref 36.0–46.0)
Hemoglobin: 7.6 g/dL — ABNORMAL LOW (ref 12.0–15.0)
Immature Granulocytes: 2 %
Lymphocytes Relative: 13 %
Lymphs Abs: 1.9 10*3/uL (ref 0.7–4.0)
MCH: 32.2 pg (ref 26.0–34.0)
MCHC: 35 g/dL (ref 30.0–36.0)
MCV: 91.9 fL (ref 80.0–100.0)
Monocytes Absolute: 1.6 10*3/uL — ABNORMAL HIGH (ref 0.1–1.0)
Monocytes Relative: 11 %
Neutro Abs: 11.3 10*3/uL — ABNORMAL HIGH (ref 1.7–7.7)
Neutrophils Relative %: 74 %
Platelets: 114 10*3/uL — ABNORMAL LOW (ref 150–400)
RBC: 2.36 MIL/uL — ABNORMAL LOW (ref 3.87–5.11)
RDW: 15.7 % — ABNORMAL HIGH (ref 11.5–15.5)
WBC: 15.1 10*3/uL — ABNORMAL HIGH (ref 4.0–10.5)
nRBC: 0.1 % (ref 0.0–0.2)

## 2021-11-15 MED ORDER — RHO D IMMUNE GLOBULIN 1500 UNIT/2ML IJ SOSY
300.0000 ug | PREFILLED_SYRINGE | Freq: Once | INTRAMUSCULAR | Status: AC
  Administered 2021-11-15: 300 ug via INTRAVENOUS
  Filled 2021-11-15: qty 2

## 2021-11-15 MED ORDER — SODIUM CHLORIDE 0.9% IV SOLUTION: Freq: Once | INTRAVENOUS | Status: AC

## 2021-11-15 NOTE — Lactation Note (Signed)
This note was copied from a baby's chart. Lactation Consultation Note  Patient Name: Ana Davis KGOVP'[id number] Date: 11/15/2021 Reason for consult: Follow-up assessment;Late-preterm 34-36.6wks Age:41 hours  Mom discouraged due to not collecting milk when pumping.  Mom was reassured that pumping consistently will help.  Mom feels baby is doing well the DBM.  She recently fed and infant was asleep.  Mom was encouraged to call out for assistance when needed.  Maternal Data    Feeding Mother's Current Feeding Choice: Breast Milk and Donor Milk Nipple Type: Dr. Clement Husbands  LATCH Score                    Lactation Tools Discussed/Used    Interventions Interventions: Education  Discharge Pump: DEBP  Consult Status Consult Status: Follow-up Date: 11/16/21 Follow-up type: In-patient    [name] 11/15/2021, 10:10 PM

## 2021-11-15 NOTE — Progress Notes (Signed)
POD# 1   S: Pt notes pain controlled w/ po meds, minimal lochia, nl void, out of bed w/o dizziness or chest pain, occasional heart racing, tol reg po, + flatus. Pt is  breastfeeding  Vitals:   11/14/21 1719 11/14/21 2205 11/15/21 0207 11/15/21 0458  BP: (!) 90/45 (!) 91/53 (!) 91/51 (!) 105/56  Pulse: 72 74 75 73  Resp:  18 18 16   Temp:  98.8 F (37.1 C) 98.7 F (37.1 C) 98.4 F (36.9 C)  TempSrc:  Oral Oral Oral  SpO2: 100% 97% 97% 100%  Weight:      Height:        Gen: well appearing CV: RRR Pulm: CTAB Abd: soft distension and tympanitic, approp tender, fundus below umbilicus, NT Inc: pressure dressing in place, no staining LE: tr edema, NT  CBC    Component Value Date/Time   WBC 13.8 (H) 11/15/2021 0437   RBC 1.89 (L) 11/15/2021 0437   HGB 6.1 (LL) 11/15/2021 0437   HGB 12.7 04/01/2021 1000   HCT 18.6 (L) 11/15/2021 0437   HCT 38.2 04/01/2021 1000   PLT 135 (L) 11/15/2021 0437   PLT 247 04/01/2021 1000   MCV 98.4 11/15/2021 0437   MCV 96 04/01/2021 1000   MCH 32.3 11/15/2021 0437   MCHC 32.8 11/15/2021 0437   RDW 13.7 11/15/2021 0437   RDW 12.2 04/01/2021 1000   LYMPHSABS 2.0 04/01/2021 1000   MONOABS 0.2 06/24/2017 1813   EOSABS 0.0 04/01/2021 1000   BASOSABS 0.0 04/01/2021 1000    A/P: POD#  1 s/p RCS at 35 wks after PPROM - baby in room - post-op. Doing well. But given severe anemia, hypotension and pt's concerns about low blood count will plan pRBC transfusion. R/B blood d/w pt. Will likely need IV iron tomorrow to support Hgb for surgical healing and breastfeeding  Ala Dach 11/15/2021 7:19 AM

## 2021-11-16 LAB — TYPE AND SCREEN
ABO/RH(D): O NEG
Antibody Screen: POSITIVE
Unit division: 0
Unit division: 0

## 2021-11-16 LAB — CBC WITH DIFFERENTIAL/PLATELET
Abs Immature Granulocytes: 0.21 10*3/uL — ABNORMAL HIGH (ref 0.00–0.07)
Basophils Absolute: 0 10*3/uL (ref 0.0–0.1)
Basophils Relative: 0 %
Eosinophils Absolute: 0 10*3/uL (ref 0.0–0.5)
Eosinophils Relative: 0 %
HCT: 22.6 % — ABNORMAL LOW (ref 36.0–46.0)
Hemoglobin: 7.7 g/dL — ABNORMAL LOW (ref 12.0–15.0)
Immature Granulocytes: 1 %
Lymphocytes Relative: 12 %
Lymphs Abs: 1.9 10*3/uL (ref 0.7–4.0)
MCH: 31.8 pg (ref 26.0–34.0)
MCHC: 34.1 g/dL (ref 30.0–36.0)
MCV: 93.4 fL (ref 80.0–100.0)
Monocytes Absolute: 1.2 10*3/uL — ABNORMAL HIGH (ref 0.1–1.0)
Monocytes Relative: 8 %
Neutro Abs: 12 10*3/uL — ABNORMAL HIGH (ref 1.7–7.7)
Neutrophils Relative %: 79 %
Platelets: 131 10*3/uL — ABNORMAL LOW (ref 150–400)
RBC: 2.42 MIL/uL — ABNORMAL LOW (ref 3.87–5.11)
RDW: 15.7 % — ABNORMAL HIGH (ref 11.5–15.5)
WBC: 15.4 10*3/uL — ABNORMAL HIGH (ref 4.0–10.5)
nRBC: 0.6 % — ABNORMAL HIGH (ref 0.0–0.2)

## 2021-11-16 LAB — RH IG WORKUP (INCLUDES ABO/RH)
Fetal Screen: NEGATIVE
Gestational Age(Wks): 35.1
Unit division: 0

## 2021-11-16 LAB — BPAM RBC
Blood Product Expiration Date: 202211232359
Blood Product Expiration Date: 202211232359
ISSUE DATE / TIME: 202211190847
ISSUE DATE / TIME: 202211191145
Unit Type and Rh: 9500
Unit Type and Rh: 9500

## 2021-11-16 MED ORDER — SODIUM CHLORIDE 0.9 % IV SOLN
510.0000 mg | Freq: Once | INTRAVENOUS | Status: AC
  Administered 2021-11-16: 510 mg via INTRAVENOUS
  Filled 2021-11-16: qty 17

## 2021-11-16 NOTE — Lactation Note (Signed)
This note was copied from a baby's chart. Lactation Consultation Note  Patient Name: Ana Davis [id number]'L Date: 11/16/2021 Reason for consult: Follow-up assessment;Late-preterm 34-36.6wks;Infant < 6lbs;Multiple gestation Age:41 hours   P3 mother whose infant is now 19 hours old.  This is a LPTI at 35+1 with a CGA of 35+3 weeks. Mother breast fed her first child (now 17 years old) for 4 months.  Mother's current feeding preference is breast/donor breast milk.  Mother has been having difficulty latching "Ana Davis" to the breast but has been trying consistently.  Reviewed characteristics of the LPTI and reassurance provided.  Offered to assist and mother receptive.  Reviewed hand expression; no drops obtained.  Assisted to latch in the football hold to the left breast.  Demonstrated breast compressions and gentle stimulation.  "Ana Davis" began feeding actively and with a wide gape.  Intermittent swallows noted.  Mother felt tugging at her breast and uterine contractions.  Observed him feeding for 10 minutes before he self released.  Reviewed paced bottle feeding and suggested father bottle feed while mother pumps.  Encouraged frequent burping.  Reviewed the DEBP set up and #24 flange size is appropriate at this time.  Mother made aware of how to assess flange size as I believe she will need to increase to the #27 in the near future.  Wash station set up and cleaning discussed.  Mother will feed at least every three hours or sooner if "Ana Davis" shows cues.  Asked mother to call her RN/LC for assistance as needed.  Praised parents for their combined efforts in working well with "Ana Davis."  Father bottle feeding when I left the room.  RN updated.   Maternal Data Has patient been taught Hand Expression?: Yes Does the patient have breastfeeding experience prior to this delivery?: No  Feeding Mother's Current Feeding Choice: Breast Milk and Donor Milk  LATCH Score Latch: Repeated attempts needed to  sustain latch, nipple held in mouth throughout feeding, stimulation needed to elicit sucking reflex.  Audible Swallowing: A few with stimulation  Type of Nipple: Everted at rest and after stimulation  Comfort (Breast/Nipple): Soft / non-tender  Hold (Positioning): Assistance needed to correctly position infant at breast and maintain latch.  LATCH Score: 7   Lactation Tools Discussed/Used Breast pump type: Double-Electric Breast Pump;Manual Pump Education: Setup, frequency, and cleaning (Reviewed) Reason for Pumping: Breast stimulation for supplementation for LPTI Pumping frequency: Every three hours  Interventions Interventions: Breast feeding basics reviewed;Assisted with latch;Skin to skin;Breast massage;Hand express;Breast compression;Hand pump;Position options;Support pillows;Adjust position;DEBP;Education  Discharge Pump: DEBP;Manual;Personal  Consult Status Consult Status: Follow-up Date: 11/17/21 Follow-up type: In-patient    [name] 11/16/2021, 9:43 AM

## 2021-11-16 NOTE — Progress Notes (Signed)
POD# 2 s/p PCS for PPROM with PTL.  S: Pt notes pain controlled w/ po meds, minimal lochia, nl void, out of bed w/o dizziness or chest pain, tol reg po, + flatus. Pt is  breastfeeding. Pt concerned about her blood count.  Vitals:   11/15/21 1211 11/15/21 1434 11/15/21 2017 11/16/21 0513  BP: (!) 96/54 104/67 124/62 108/61  Pulse: 81 78 86 87  Resp: 18 18 16 14   Temp: 98.4 F (36.9 C) 98.2 F (36.8 C) 97.6 F (36.4 C) 98.4 F (36.9 C)  TempSrc: Oral Oral Oral Oral  SpO2: 97% 99% 100% 99%  Weight:      Height:        Gen: well appearing CV: RRR Pulm: CTAB Abd: soft, ND, approp tender, fundus below umbilicus, NT Inc: intact but dressing wet and plan to change.  LE: tr edema, NT  CBC    Component Value Date/Time   WBC 15.1 (H) 11/15/2021 1734   RBC 2.36 (L) 11/15/2021 1734   HGB 7.6 (L) 11/15/2021 1734   HGB 12.7 04/01/2021 1000   HCT 21.7 (L) 11/15/2021 1734   HCT 38.2 04/01/2021 1000   PLT 114 (L) 11/15/2021 1734   PLT 247 04/01/2021 1000   MCV 91.9 11/15/2021 1734   MCV 96 04/01/2021 1000   MCH 32.2 11/15/2021 1734   MCHC 35.0 11/15/2021 1734   RDW 15.7 (H) 11/15/2021 1734   RDW 12.2 04/01/2021 1000   LYMPHSABS 1.9 11/15/2021 1734   LYMPHSABS 2.0 04/01/2021 1000   MONOABS 1.6 (H) 11/15/2021 1734   EOSABS 0.0 11/15/2021 1734   EOSABS 0.0 04/01/2021 1000   BASOSABS 0.0 11/15/2021 1734   BASOSABS 0.0 04/01/2021 1000    A/P: POD#  2 s/p PCS, PTL/ PPROM - post-op. Doing well.  - Anemia, no evidence continued blood loss, minimal symptoms. S/p 2 upRBC, will give IV iron today - low plts, recheck in am   Ala Dach 11/16/2021 12:00 PM

## 2021-11-17 ENCOUNTER — Other Ambulatory Visit (HOSPITAL_COMMUNITY): Payer: Self-pay

## 2021-11-17 LAB — SURGICAL PATHOLOGY

## 2021-11-17 MED ORDER — OXYCODONE HCL 5 MG PO TABS
5.0000 mg | ORAL_TABLET | ORAL | 0 refills | Status: DC | PRN
  Filled 2021-11-17: qty 30, 3d supply, fill #0

## 2021-11-17 MED ORDER — IRON (FERROUS SULFATE) 325 (65 FE) MG PO TABS
1.0000 | ORAL_TABLET | Freq: Every day | ORAL | 3 refills | Status: DC
  Filled 2021-11-17: qty 30, 30d supply, fill #0

## 2021-11-17 MED ORDER — IBUPROFEN 600 MG PO TABS
600.0000 mg | ORAL_TABLET | Freq: Four times a day (QID) | ORAL | 0 refills | Status: DC
  Filled 2021-11-17: qty 30, 8d supply, fill #0

## 2021-11-17 NOTE — Discharge Summary (Signed)
Postpartum Discharge Summary  Date of Service updated     Patient Name: Ana Davis DOB: 1981-09-13 MRN: [id number]  Date of admission: 11/12/2021 Delivery date:11/14/2021  Delivering provider: Langley Gauss A  Date of discharge: 11/17/2021  Admitting diagnosis: PROM (premature rupture of membranes) [O42.90] Previous cesarean section [Z98.891] Intrauterine pregnancy: [redacted]w[redacted]d    Secondary diagnosis:  Principal Problem:   Postpartum care following cesarean delivery 11/18 Active Problems:   S/P C-section Indication: repeat, PPROM 35.1 wks   PROM (premature rupture of membranes)   Previous cesarean section  Additional problems: severe anemia    Discharge diagnosis: Preterm Pregnancy Delivered and Anemia                                              Post partum procedures:blood transfusion Augmentation: N/A Complications: None  Hospital course: Onset of Labor With Unplanned C/S   40y.o. yo [id number] admitted for pprom  11/12/2021. Patient had a labor course significant for pprom. The patient went for cesarean section due to  labor, h/o c/s . Delivery details as follows: Membrane Rupture Time/Date: 6:00 PM ,11/12/2021   Delivery Method:C-Section, Low Transverse  Details of operation can be found in separate operative note. Patient had an uncomplicated postpartum course.  She is ambulating,tolerating a regular diet, passing flatus, and urinating well.  Patient is discharged home in stable condition 11/17/21.  Newborn Data: Birth date:11/14/2021  Birth time:7:48 AM  Gender:Female  Living status:Living  Apgars:9 ,9  Weight:2490 g   Magnesium Sulfate received: No BMZ received: Yes Transfusion:Yes  Physical exam  Vitals:   11/16/21 0513 11/16/21 1621 11/16/21 2034 11/17/21 0631  BP: 108/61 119/63 127/81 100/66  Pulse: 87 88 86 86  Resp: '14 18 16 15  ' Temp: 98.4 F (36.9 C) 97.9 F (36.6 C) 98.1 F (36.7 C) 98.1 F (36.7 C)  TempSrc: Oral  Oral    SpO2: 99% 98% 100% 98%  Weight:      Height:       General: alert, cooperative, and no distress Lochia: appropriate Uterine Fundus: firm Incision: Healing well with no significant drainage DVT Evaluation: No evidence of DVT seen on physical exam. Labs: Lab Results  Component Value Date   WBC 15.4 (H) 11/16/2021   HGB 7.7 (L) 11/16/2021   HCT 22.6 (L) 11/16/2021   MCV 93.4 11/16/2021   PLT 131 (L) 11/16/2021   CMP Latest Ref Rng & Units 04/01/2021  Glucose 65 - 99 mg/dL 88  BUN 6 - 24 mg/dL 8  Creatinine 0.57 - 1.00 mg/dL 0.90  Sodium 134 - 144 mmol/L 137  Potassium 3.5 - 5.2 mmol/L 4.1  Chloride 96 - 106 mmol/L 100  CO2 20 - 29 mmol/L 22  Calcium 8.7 - 10.2 mg/dL 9.0  Total Protein 6.0 - 8.5 g/dL 7.5  Total Bilirubin 0.0 - 1.2 mg/dL 0.6  Alkaline Phos 44 - 121 IU/L 64  AST 0 - 40 IU/L 16  ALT 0 - 32 IU/L 12   Edinburgh Score: Edinburgh Postnatal Depression Scale Screening Tool 11/14/2021  I have been able to laugh and see the funny side of things. 0  I have looked forward with enjoyment to things. 0  I have blamed myself unnecessarily when things went wrong. 1  I have been anxious or worried for no good reason. 1  I  have felt scared or panicky for no good reason. 2  Things have been getting on top of me. 1  I have been so unhappy that I have had difficulty sleeping. 1  I have felt sad or miserable. 1  I have been so unhappy that I have been crying. 1  The thought of harming myself has occurred to me. 0  Edinburgh Postnatal Depression Scale Total 8      After visit meds:  Allergies as of 11/17/2021       Reactions   Hydrocodone    Hallucinations        Medication List     STOP taking these medications    acetaminophen 500 MG tablet Commonly known as: TYLENOL   ELDERBERRY PO   fluconazole 150 MG tablet Commonly known as: Diflucan   freestyle lancets   FREESTYLE LITE test strip Generic drug: glucose blood   FreeStyle Lite w/Device Kit    Lancing Device Misc   NIFEdipine 10 MG capsule Commonly known as: PROCARDIA   terconazole 0.4 % vaginal cream Commonly known as: TERAZOL 7   vitamin C 1000 MG tablet   Vitamin D 125 MCG (5000 UT) Caps   ZINC PO       TAKE these medications    cetirizine 10 MG tablet Commonly known as: ZYRTEC Take 10 mg by mouth daily as needed for allergies.   ibuprofen 600 MG tablet Commonly known as: ADVIL Take 1 tablet (600 mg total) by mouth every 6 (six) hours. What changed:  when to take this reasons to take this   Iron (Ferrous Sulfate) 325 (65 Fe) MG Tabs Take 1 tablet by mouth daily.   oxyCODONE 5 MG immediate release tablet Commonly known as: Oxy IR/ROXICODONE Take 1-2 tablets (5-10 mg total) by mouth every 4 (four) hours as needed for moderate pain.         Discharge home in stable condition Infant Feeding: Breast Infant Disposition:home with mother Discharge instruction: per After Visit Summary and Postpartum booklet. Activity: Advance as tolerated. Pelvic rest for 6 weeks.  Diet: carb modified diet Anticipated Birth Control: Unsure Postpartum Appointment:6 weeks Additional Postpartum F/U:  f/u d/t severe anemia , s/p prbc and iv iron Future Appointments: Future Appointments  Date Time Provider [place]  04/07/2022  9:00 AM Dorena Dew, FNP [facility] None   Follow up Visit:  Follow-up Information     Azucena Fallen, MD Follow up.   Specialty: Obstetrics and Gynecology Contact information: [address] [place] [address] [phone]                     11/17/2021 Charyl Bigger, MD

## 2021-11-17 NOTE — Lactation Note (Signed)
This note was copied from a baby's chart. Lactation Consultation Note  Patient Name: Ana Davis [id number] Date: 11/17/2021 Reason for consult: Follow-up assessment;Late-preterm 34-36.6wks;Infant < 6lbs Age:41 hours   P3 mother whose infant is now 73 hours old.  This is a LPTI at 35+1 weeks with a CGA of 35+4 weeks.  Mother breast fed her first Ana Davis (now 41 years old) for 4 months.  Mother's current feeding preference is breast/donor breast milk.  Reviewed current feeding plan.  Mother stated that her son has been feeding better and she appreciated the help given yesterday.  Father enjoyed learning how to swaddle.  She will continue to breast feed and supplement after discharge.  Mother had no further questions/concerns at this time.  Baby is voiding/stooling well.  Mother has our OP phone number for any further questions.  She has a DEBP for home use.  Father present.  Baby has been discharged.   Maternal Data    Feeding Mother's Current Feeding Choice: Breast Milk and Donor Milk  LATCH Score                    Lactation Tools Discussed/Used Breast pump type: Double-Electric Breast Pump;Manual  Interventions Interventions: Education  Discharge Discharge Education: Engorgement and breast care Pump: Personal  Consult Status Consult Status: Complete Date: 11/17/21 Follow-up type: In-patient    Ana Davis 11/17/2021, 12:07 PM

## 2021-11-17 NOTE — Progress Notes (Signed)
Feels bloated though +flatus, nml lochia; pain controlled; tol po, ambulating; voiding w/o difficulty Breastfeeding; boy-wants circ No sob/cp, no lightheadedness/dizziness  Patient Vitals for the past 24 hrs:  BP Temp Temp src Pulse Resp SpO2  11/17/21 0631 100/66 98.1 F (36.7 C) -- 86 15 98 %  11/16/21 2034 127/81 98.1 F (36.7 C) Oral 86 16 100 %  11/16/21 1621 119/63 97.9 F (36.6 C) -- 88 18 98 %   A&ox3 Rrr Ctab Abd: soft, nt, mild distension, +BS; fundus firm and below umb; dressing: c/d/I LE: no edema, nt bilat  CBC Latest Ref Rng & Units 11/16/2021 11/15/2021 11/15/2021  WBC 4.0 - 10.5 K/uL 15.4(H) 15.1(H) 13.8(H)  Hemoglobin 12.0 - 15.0 g/dL 7.7(L) 7.6(L) 6.1(LL)  Hematocrit 36.0 - 46.0 % 22.6(L) 21.7(L) 18.6(L)  Platelets 150 - 400 K/uL 131(L) 114(L) 135(L)   A/P: pod3 s/p rltcs Doing well - plan d/c home today, f/u severe anemia 1 wk Chronic anemia with acute change d/t blood loss - s/p 2 units prbc, s/p iv iron, plan daily iron with d/c Rh neg, s/p rhogam RI Breastfeeding Low plts - normalizing yesterday, anticipate continued trend, f/u outpt Fibroids Asthma-stable GDMA1 - f/u pp Baby boy - plans circ

## 2021-11-18 DIAGNOSIS — Z3482 Encounter for supervision of other normal pregnancy, second trimester: Secondary | ICD-10-CM | POA: Diagnosis not present

## 2021-11-18 DIAGNOSIS — Z3483 Encounter for supervision of other normal pregnancy, third trimester: Secondary | ICD-10-CM | POA: Diagnosis not present

## 2021-11-24 DIAGNOSIS — Z4801 Encounter for change or removal of surgical wound dressing: Secondary | ICD-10-CM | POA: Diagnosis not present

## 2021-11-25 ENCOUNTER — Telehealth (HOSPITAL_COMMUNITY): Payer: Self-pay

## 2021-11-25 NOTE — Telephone Encounter (Signed)
"  I'm doing ok. How do I get an appointment with a lactation?" RN provided [facility] resources to patient. "When can I start really exercising again?" RN told patient that usually the OB-GYN will see the mom 6 weeks postpartum and do an exam and then see if normal activities can be resumed. Patient has no other questions or concerns about her healing.  "He's doing ok. He is up every 2 hours feeding. I'm pumping and giving him the breastmilk in a bottle because he won't latch. He also gets formula. He gets fussy at the breast and won't latch. He has gained back to birthweight." RN encouraged patient to reach out to [facility] resources RN provided her with. He sleeps in a bassinet." RN reviewed ABC's of safe sleep with patient. Patient declines any questions or concerns about baby.  EPDS score is 11. Dr. Benjie Karvonen notified via fax of score.   RN told patient about Maternal Mental Health Resources ([facility], [employer]). Also told patient about [facility] support group offerings. Will e-mail these resources to patient as well.  Sharyn Lull [facility] 11/25/2021,1924

## 2021-12-11 ENCOUNTER — Inpatient Hospital Stay (HOSPITAL_COMMUNITY): Admit: 2021-12-11 | Payer: 59 | Admitting: Obstetrics & Gynecology

## 2021-12-26 ENCOUNTER — Other Ambulatory Visit (HOSPITAL_COMMUNITY): Payer: Self-pay

## 2021-12-26 DIAGNOSIS — M868X8 Other osteomyelitis, other site: Secondary | ICD-10-CM | POA: Diagnosis not present

## 2021-12-26 DIAGNOSIS — Z8632 Personal history of gestational diabetes: Secondary | ICD-10-CM | POA: Diagnosis not present

## 2021-12-26 MED ORDER — NORETHINDRONE 0.35 MG PO TABS
ORAL_TABLET | ORAL | 3 refills | Status: DC
  Filled 2021-12-26: qty 84, 84d supply, fill #0
  Filled 2022-03-16: qty 84, 84d supply, fill #1

## 2022-03-16 ENCOUNTER — Other Ambulatory Visit (HOSPITAL_COMMUNITY): Payer: Self-pay

## 2022-03-16 DIAGNOSIS — Z309 Encounter for contraceptive management, unspecified: Secondary | ICD-10-CM | POA: Diagnosis not present

## 2022-03-16 DIAGNOSIS — M868X8 Other osteomyelitis, other site: Secondary | ICD-10-CM | POA: Diagnosis not present

## 2022-03-16 MED ORDER — NORETHIN ACE-ETH ESTRAD-FE 1-20 MG-MCG PO TABS
ORAL_TABLET | ORAL | 4 refills | Status: DC
  Filled 2022-03-16: qty 84, 84d supply, fill #0
  Filled 2022-05-28: qty 84, 84d supply, fill #1
  Filled 2022-08-29: qty 84, 84d supply, fill #2
  Filled 2022-11-27: qty 84, 84d supply, fill #3
  Filled 2023-01-27 – 2023-02-16 (×2): qty 84, 84d supply, fill #4

## 2022-04-07 ENCOUNTER — Encounter: Payer: 59 | Admitting: Family Medicine

## 2022-05-26 ENCOUNTER — Encounter: Payer: 59 | Admitting: Family Medicine

## 2022-05-28 ENCOUNTER — Ambulatory Visit (INDEPENDENT_AMBULATORY_CARE_PROVIDER_SITE_OTHER): Payer: 59 | Admitting: Family Medicine

## 2022-05-28 ENCOUNTER — Other Ambulatory Visit (HOSPITAL_COMMUNITY): Payer: Self-pay

## 2022-05-28 ENCOUNTER — Encounter: Payer: Self-pay | Admitting: Family Medicine

## 2022-05-28 VITALS — BP 114/68 | HR 63 | Temp 98.1°F | Ht 59.0 in | Wt 146.2 lb

## 2022-05-28 DIAGNOSIS — Z0000 Encounter for general adult medical examination without abnormal findings: Secondary | ICD-10-CM | POA: Diagnosis not present

## 2022-05-28 LAB — POCT URINALYSIS DIP (CLINITEK)
Bilirubin, UA: NEGATIVE
Blood, UA: NEGATIVE
Glucose, UA: NEGATIVE mg/dL
Ketones, POC UA: NEGATIVE mg/dL
Leukocytes, UA: NEGATIVE
Nitrite, UA: NEGATIVE
POC PROTEIN,UA: NEGATIVE
Spec Grav, UA: 1.025 (ref 1.010–1.025)
Urobilinogen, UA: 0.2 E.U./dL
pH, UA: 6 (ref 5.0–8.0)

## 2022-05-28 NOTE — Progress Notes (Signed)
ANNUAL PREVENTATIVE VISIT AND CPE  Subjective:  Ana Davis is a 41 y.o. female who presents for routine physical examination.  Patient states that she has been doing well over the past year and is without complaint.  Patient recently delivered a baby boy on 11/12/2021.  He was delivered at 34 weeks and 6 days.  Patient underwent cesarean section. Patient is currently married with 2 children. She is followed by her dentist twice yearly.  Patient typically gets eye exams every other year.  She exercises around 6 days/week.  Patient eats a generally healthy diet divided over small meals throughout the day.  She wears sunscreen consistently.  Patient also wears a seatbelt.  Patient is up-to-date with her Pap smear which was 6 months prior.  Lab Results  Component Value Date   CHOL 153 04/01/2021   HDL 68 04/01/2021   LDLCALC 74 04/01/2021   TRIG 50 04/01/2021   CHOLHDL 2.3 04/01/2021   Lab Results  Component Value Date   HGBA1C 5.3 11/13/2021   Patient is on Vitamin D supplement.   Lab Results  Component Value Date   VD25OH 45.4 04/01/2021       Names of Other Physician/Practitioners you currently use: [facility] here for primary care Patient Care Team: Dorena Dew, FNP as PCP - General (Family Medicine)   Medication Review: Current Outpatient Medications on File Prior to Visit  Medication Sig Dispense Refill   cetirizine (ZYRTEC) 10 MG tablet Take 10 mg by mouth daily as needed for allergies.      cholecalciferol (VITAMIN D3) 25 MCG (1000 UNIT) tablet Take 1,000 Units by mouth daily.     ibuprofen (ADVIL) 600 MG tablet Take 1 tablet (600 mg total) by mouth every 6 (six) hours. 30 tablet 0   norethindrone-ethinyl estradiol-FE (JUNEL FE 1/20) 1-20 MG-MCG tablet Take 1 tablet every day by mouth.                                                                                                     . 84 tablet 4   Iron, Ferrous Sulfate, 325 (65 Fe) MG  TABS Take 1 tablet by mouth daily. (Patient not taking: Reported on 05/28/2022) 30 tablet 3   norethindrone (ORTHO MICRONOR) 0.35 MG tablet Take 1 tablet by mouth every day (Patient not taking: Reported on 05/28/2022) 84 tablet 3   oxyCODONE (OXY IR/ROXICODONE) 5 MG immediate release tablet Take 1-2 tablets (5-10 mg total) by mouth every 4 (four) hours as needed for moderate pain. (Patient not taking: Reported on 05/28/2022) 30 tablet 0   No current facility-administered medications on file prior to visit.    Current Problems (verified) Patient Active Problem List   Diagnosis Date Noted   PROM (premature rupture of membranes) 11/12/2021   Previous cesarean section 11/12/2021   Endometritis following delivery 06/24/2017   Postpartum care following cesarean delivery 11/18 06/14/2017   S/P C-section Indication: repeat, PPROM 35.1 wks 06/14/2017   Fibroids 11/06/2015   Cervical high risk HPV (human papillomavirus) test positive 10/04/2014    Screening Tests  Health Maintenance  Topic Date Due   COVID-19 Vaccine (1) Never done   URINE MICROALBUMIN  Never done   INFLUENZA VACCINE  07/28/2022   PAP SMEAR-Modifier  03/28/2024   TETANUS/TDAP  03/30/2027   Hepatitis C Screening  Completed   HIV Screening  Completed   HPV VACCINES  Aged Out    Immunization History  Administered Date(s) Administered   Hepatitis A 02/16/2012   Influenza Split 09/18/2014   Influenza,inj,Quad PF,6+ Mos 09/27/2001   Influenza-Unspecified 09/29/2016   Typhoid Inactivated 02/16/2012   Yellow Fever 02/16/2012     Allergies as of 05/28/2022       Reactions   Hydrocodone    Hallucinations        Medication List        Accurate as of May 28, 2022 10:30 AM. If you have any questions, ask your nurse or doctor.          [place] FE 1/20 1-20 MG-MCG tablet Generic drug: norethindrone-ethinyl estradiol-FE Take 1 tablet every day by mouth.                                                                                                      .   cetirizine 10 MG tablet Commonly known as: ZYRTEC Take 10 mg by mouth daily as needed for allergies.   cholecalciferol 25 MCG (1000 UNIT) tablet Commonly known as: VITAMIN D3 Take 1,000 Units by mouth daily.   ibuprofen 600 MG tablet Commonly known as: ADVIL Take 1 tablet (600 mg total) by mouth every 6 (six) hours.   Iron (Ferrous Sulfate) 325 (65 Fe) MG Tabs Take 1 tablet by mouth daily.   norethindrone 0.35 MG tablet Commonly known as: Ortho Micronor Take 1 tablet by mouth every day   oxyCODONE 5 MG immediate release tablet Commonly known as: Oxy IR/ROXICODONE Take 1-2 tablets (5-10 mg total) by mouth every 4 (four) hours as needed for moderate pain.        Past Surgical History:  Procedure Laterality Date   CESAREAN SECTION N/A 06/14/2017   Procedure: Primary CESAREAN SECTION;  Surgeon: Azucena Fallen, MD;  Location: [place];  Service: Obstetrics;  Laterality: N/A;  EDD: 06/20/17 Allergy: Codeine   CESAREAN SECTION N/A 11/14/2021   Procedure: Repeat CESAREAN SECTION;  Surgeon: Langley Gauss A, DO;  Location: [facility];  Service: Obstetrics;  Laterality: N/A;  EDD: 12/18/21   COLONOSCOPY  08/2015   WISDOM TOOTH EXTRACTION     Family History  Problem Relation Age of Onset   Cancer Maternal Grandmother        great gran- breast   Seizures Maternal Grandmother    Breast cancer Maternal Grandmother    Diabetes Maternal Grandfather    Kidney disease Maternal Grandfather    Hypertension Father    Arthritis Paternal Grandfather     History reviewed: allergies, current medications, past family history, past medical history, past social history, past surgical history and problem list    Tobacco Social History   Tobacco Use   Smoking status: Never   Smokeless tobacco:  Never  Vaping Use   Vaping Use: Never used  Substance Use Topics   Alcohol use: Not Currently    Comment: social   Drug use: No   She does not  smoke.  Patient is not a former smoker. Are there smokers in your home (other than you)?  No  Alcohol Current alcohol use: none  Caffeine Current caffeine use: denies use  Exercise Current exercise: aerobics, bicycling, walking, and weightlifting  Nutrition/Diet Current diet: in general, a "healthy" diet    Cardiac risk factors: none.  Depression Screen    05/28/2022   10:34 AM 05/28/2022   10:05 AM 04/01/2021    9:03 AM  PHQ9 SCORE ONLY  PHQ-9 Total Score 0 5 0     Vision Difficulties: No Review of Systems  Constitutional: Negative.   HENT: Negative.    Eyes: Negative.   Respiratory: Negative.    Cardiovascular: Negative.   Gastrointestinal: Negative.   Genitourinary: Negative.   Musculoskeletal: Negative.   Skin: Negative.   Neurological: Negative.   Endo/Heme/Allergies: Negative.   Psychiatric/Behavioral: Negative.     Objective:     Blood pressure 114/68, pulse 63, temperature 98.1 F (36.7 C), height '4\' 11"'$  (1.499 m), weight 146 lb 4 oz (66.3 kg), SpO2 100 %, unknown if currently breastfeeding. Body mass index is 29.54 kg/m.  General appearance: alert, no distress, WD/WN, female  HEENT: normocephalic, sclerae anicteric, TMs pearly, nares patent, no discharge or erythema, pharynx normal Oral cavity: MMM, no lesions Neck: supple, no lymphadenopathy, no thyromegaly, no masses Heart: RRR, normal S1, S2, no murmurs Lungs: CTA bilaterally, no wheezes, rhonchi, or rales Abdomen: +bs, soft, non tender, non distended, no masses, no hepatomegaly, no splenomegaly Musculoskeletal: nontender, no swelling, no obvious deformity Extremities: no edema, no cyanosis, no clubbing Pulses: 2+ symmetric, upper and lower extremities, normal cap refill Neurological: alert, oriented x 3, CN2-12 intact, strength normal upper extremities and lower extremities, sensation normal throughout, DTRs 2+ throughout, no cerebellar signs, gait normal Psychiatric: normal affect, behavior  normal, pleasant   Assessment:  Patient denies any difficulties at home. No trouble with ADLs, depression or falls. No recent changes to vision or hearing. Is UTD with immunizations. Is UTD with screening. . Encouraged heart healthy diet, exercise as tolerated and adequate sleep. Declines flu shot. Pap smear done 6 months ago.     Plan:   During the course of the visit the patient was educated and counseled about appropriate screening and preventive services including:   Pneumococcal vaccine  Influenza vaccine Td vaccine Screening electrocardiogram Bone densitometry screening Colorectal cancer screening Diabetes screening Glaucoma screening Nutrition counseling  Advanced directives: requested  Screening recommendations, referrals: Vaccinations: Please see documentation below and orders this visit.  Nutrition assessed and recommended  Colonoscopy not indicated Recommended yearly ophthalmology/optometry visit for glaucoma screening and checkup Recommended yearly dental visit for hygiene and checkup Advanced directives - requested  Conditions/risks identified: BMI: Discussed weight loss, diet, and increase physical activity.  Increase physical activity: AHA recommends 150 minutes of physical activity a week.  assessment, medications.   The patient's weight, height, BMI, and visual acuity have been recorded in the chart.  I have made referrals, counseling, and provided education to the patient based on review of the above and I have provided the patient with a written personalized care plan for preventive services.      [name]  APRN, MSN, FNP-C Patient [place], [place]  [id number] [phone]    

## 2022-05-29 LAB — THYROID PANEL WITH TSH
Free Thyroxine Index: 2 (ref 1.2–4.9)
T3 Uptake Ratio: 25 % (ref 24–39)
T4, Total: 7.8 ug/dL (ref 4.5–12.0)
TSH: 1.7 u[IU]/mL (ref 0.450–4.500)

## 2022-05-29 LAB — CBC WITH DIFFERENTIAL/PLATELET
Basophils Absolute: 0 10*3/uL (ref 0.0–0.2)
Basos: 0 %
EOS (ABSOLUTE): 0.1 10*3/uL (ref 0.0–0.4)
Eos: 1 %
Hematocrit: 37.9 % (ref 34.0–46.6)
Hemoglobin: 12.5 g/dL (ref 11.1–15.9)
Immature Grans (Abs): 0 10*3/uL (ref 0.0–0.1)
Immature Granulocytes: 0 %
Lymphocytes Absolute: 2.6 10*3/uL (ref 0.7–3.1)
Lymphs: 39 %
MCH: 31.5 pg (ref 26.6–33.0)
MCHC: 33 g/dL (ref 31.5–35.7)
MCV: 96 fL (ref 79–97)
Monocytes Absolute: 0.4 10*3/uL (ref 0.1–0.9)
Monocytes: 6 %
Neutrophils Absolute: 3.6 10*3/uL (ref 1.4–7.0)
Neutrophils: 54 %
Platelets: 281 10*3/uL (ref 150–450)
RBC: 3.97 x10E6/uL (ref 3.77–5.28)
RDW: 11.5 % — ABNORMAL LOW (ref 11.7–15.4)
WBC: 6.7 10*3/uL (ref 3.4–10.8)

## 2022-05-29 LAB — COMPREHENSIVE METABOLIC PANEL
ALT: 14 IU/L (ref 0–32)
AST: 16 IU/L (ref 0–40)
Albumin/Globulin Ratio: 1.3 (ref 1.2–2.2)
Albumin: 3.9 g/dL (ref 3.8–4.8)
Alkaline Phosphatase: 65 IU/L (ref 44–121)
BUN/Creatinine Ratio: 15 (ref 9–23)
BUN: 13 mg/dL (ref 6–24)
Bilirubin Total: <0.2 mg/dL (ref 0.0–1.2)
CO2: 23 mmol/L (ref 20–29)
Calcium: 8.5 mg/dL — ABNORMAL LOW (ref 8.7–10.2)
Chloride: 101 mmol/L (ref 96–106)
Creatinine, Ser: 0.86 mg/dL (ref 0.57–1.00)
Globulin, Total: 2.9 g/dL (ref 1.5–4.5)
Glucose: 128 mg/dL — ABNORMAL HIGH (ref 70–99)
Potassium: 3.4 mmol/L — ABNORMAL LOW (ref 3.5–5.2)
Sodium: 138 mmol/L (ref 134–144)
Total Protein: 6.8 g/dL (ref 6.0–8.5)
eGFR: 86 mL/min/{1.73_m2} (ref >59)

## 2022-05-29 LAB — VITAMIN D 25 HYDROXY (VIT D DEFICIENCY, FRACTURES): Vit D, 25-Hydroxy: 64.9 ng/mL (ref 30.0–100.0)

## 2022-05-29 LAB — HEMOGLOBIN A1C
Est. average glucose Bld gHb Est-mCnc: 111 mg/dL
Hgb A1c MFr Bld: 5.5 % (ref 4.8–5.6)

## 2022-06-11 NOTE — Progress Notes (Signed)
Please notify patient to inform her that her hemoglobin was 12.5 which is much improved from 7.76 months prior.  All of the laboratory values that were associated with patient's complete physical were unremarkable.  Please follow-up in 1 year for an annual physical as previously discussed.  [name]  APRN, MSN, FNP-C Patient [facility] [address] [place], [place] [address] [phone]

## 2022-08-21 ENCOUNTER — Other Ambulatory Visit: Payer: Self-pay | Admitting: Family Medicine

## 2022-08-21 DIAGNOSIS — Z09 Encounter for follow-up examination after completed treatment for conditions other than malignant neoplasm: Secondary | ICD-10-CM

## 2022-08-29 ENCOUNTER — Other Ambulatory Visit (HOSPITAL_COMMUNITY): Payer: Self-pay

## 2022-09-08 ENCOUNTER — Ambulatory Visit
Admission: RE | Admit: 2022-09-08 | Discharge: 2022-09-08 | Disposition: A | Discharge status: Home / Self Care | Payer: 59 | Source: Ambulatory Visit | Attending: Family Medicine | Admitting: Family Medicine

## 2022-09-08 DIAGNOSIS — N6312 Unspecified lump in the right breast, upper inner quadrant: Secondary | ICD-10-CM | POA: Diagnosis not present

## 2022-09-08 DIAGNOSIS — Z09 Encounter for follow-up examination after completed treatment for conditions other than malignant neoplasm: Secondary | ICD-10-CM

## 2022-09-08 DIAGNOSIS — R922 Inconclusive mammogram: Secondary | ICD-10-CM | POA: Diagnosis not present

## 2022-11-27 ENCOUNTER — Other Ambulatory Visit (HOSPITAL_COMMUNITY): Payer: Self-pay

## 2023-01-27 ENCOUNTER — Other Ambulatory Visit (HOSPITAL_COMMUNITY): Payer: Self-pay

## 2023-02-16 ENCOUNTER — Other Ambulatory Visit (HOSPITAL_COMMUNITY): Payer: Self-pay

## 2023-03-23 ENCOUNTER — Other Ambulatory Visit (HOSPITAL_COMMUNITY): Payer: Self-pay

## 2023-03-23 DIAGNOSIS — Z01419 Encounter for gynecological examination (general) (routine) without abnormal findings: Secondary | ICD-10-CM | POA: Diagnosis not present

## 2023-03-23 MED ORDER — NORETHIN ACE-ETH ESTRAD-FE 1-20 MG-MCG PO TABS
1.0000 | ORAL_TABLET | Freq: Every day | ORAL | 4 refills | Status: DC
  Filled 2023-05-15: qty 84, 84d supply, fill #0
  Filled 2023-08-02: qty 84, 84d supply, fill #1
  Filled 2023-10-28: qty 84, 84d supply, fill #2
  Filled 2024-01-18: qty 84, 84d supply, fill #3

## 2023-03-26 ENCOUNTER — Other Ambulatory Visit (HOSPITAL_COMMUNITY): Payer: Self-pay

## 2023-05-15 ENCOUNTER — Other Ambulatory Visit (HOSPITAL_COMMUNITY): Payer: Self-pay

## 2023-06-01 ENCOUNTER — Ambulatory Visit (INDEPENDENT_AMBULATORY_CARE_PROVIDER_SITE_OTHER): Payer: Commercial Managed Care - PPO | Admitting: Family Medicine

## 2023-06-01 VITALS — BP 106/58 | HR 65 | Temp 97.7°F | Ht 59.0 in | Wt 135.6 lb

## 2023-06-01 DIAGNOSIS — Z1159 Encounter for screening for other viral diseases: Secondary | ICD-10-CM

## 2023-06-01 DIAGNOSIS — Z1211 Encounter for screening for malignant neoplasm of colon: Secondary | ICD-10-CM | POA: Diagnosis not present

## 2023-06-01 DIAGNOSIS — Z0000 Encounter for general adult medical examination without abnormal findings: Secondary | ICD-10-CM

## 2023-06-01 LAB — POCT URINALYSIS DIPSTICK
Bilirubin, UA: NEGATIVE
Glucose, UA: NEGATIVE
Ketones, UA: NEGATIVE
Leukocytes, UA: NEGATIVE
Nitrite, UA: NEGATIVE
Protein, UA: NEGATIVE
Spec Grav, UA: 1.02 (ref 1.010–1.025)
Urobilinogen, UA: 1 E.U./dL
pH, UA: 6.5 (ref 5.0–8.0)

## 2023-06-01 NOTE — Progress Notes (Signed)
Established Patient Office Visit  Subjective   Patient ID: Ana Davis, female    DOB: [birth year]  Age: 42 y.o. MRN: [id number]  Chief Complaint  Patient presents with   Annual Exam    Not fasting     Ana Davis is a 42 year old female that presents for her yearly physical.  Patient states that she has been doing very well and does not have any complaints today.  Patient's most recent physical was 1 year prior.  Patient does not have any chronic conditions. Patient states that she is generally healthy.  She exercises 6 days/week and follows a low-fat, low carbohydrate diet.  Patient is married with 2 children.  She has a normal menstrual cycle and is followed by gynecologist, patient is up-to-date with her Pap smear.  She is up-to-date with mammogram and does monthly self breast exams.  She has not had any abnormalities concerning mammogram.  Patient has not had a colonoscopy.  Patient has dental visits twice per year.  She takes a multivitamin.  Patient is up-to-date with her vaccinations.  She wears sunscreen.  Patient also wears a seatbelt.  She does not drink alcohol or use any tobacco products.  Patient denies any depression or anxiety.  There have been no changes in her health history including family or social.    Patient Active Problem List   Diagnosis Date Noted   PROM (premature rupture of membranes) 11/12/2021   Previous cesarean section 11/12/2021   Endometritis following delivery 06/24/2017   Postpartum care following cesarean delivery 11/18 06/14/2017   S/P C-section Indication: repeat, PPROM 35.1 wks 06/14/2017   Fibroids 11/06/2015   Cervical high risk HPV (human papillomavirus) test positive 10/04/2014   Past Medical History:  Diagnosis Date   Anxiety    claustrophobic   Asthma    childhood   Breech presentation 06/13/2017   Eczema    Fibroid    GDM, class A1 06/13/2017   Genital warts 2013   tx with aldara generic   Gestational diabetes     IBS (irritable bowel syndrome)    Vaginal Pap smear, abnormal    Past Surgical History:  Procedure Laterality Date   CESAREAN SECTION N/A 06/14/2017   Procedure: Primary CESAREAN SECTION;  Surgeon: Shea Evans, MD;  Location: [facility];  Service: Obstetrics;  Laterality: N/A;  EDD: 06/20/17 Allergy: Codeine   CESAREAN SECTION N/A 11/14/2021   Procedure: Repeat CESAREAN SECTION;  Surgeon: Clance Boll A, DO;  Location: [facility] LD ORS;  Service: Obstetrics;  Laterality: N/A;  EDD: 12/18/21   COLONOSCOPY  08/2015   WISDOM TOOTH EXTRACTION     Social History   Tobacco Use   Smoking status: Never   Smokeless tobacco: Never  Vaping Use   Vaping Use: Never used  Substance Use Topics   Alcohol use: Not Currently    Comment: social   Drug use: No   Social History   Socioeconomic History   Marital status: Married    Spouse name: Not on file   Number of children: Not on file   Years of education: Not on file   Highest education level: Not on file  Occupational History   Not on file  Tobacco Use   Smoking status: Never   Smokeless tobacco: Never  Vaping Use   Vaping Use: Never used  Substance and Sexual Activity   Alcohol use: Not Currently    Comment: social   Drug use: No   Sexual activity:  Yes    Partners: Male    Birth control/protection: None  Other Topics Concern   Not on file  Social History Narrative   Not on file   Social Determinants of Health   Financial Resource Strain: Not on file  Food Insecurity: Not on file  Transportation Needs: Not on file  Physical Activity: Not on file  Stress: Not on file  Social Connections: Not on file  Intimate Partner Violence: Not on file   Family Status  Relation Name Status   MGM  Alive   MGF  Deceased   Mother  Alive   Father  Alive   PGM  Alive   PGF  Deceased   Family History  Problem Relation Age of Onset   Cancer Maternal Grandmother        great gran- breast   Seizures Maternal Grandmother     Breast cancer Maternal Grandmother    Diabetes Maternal Grandfather    Kidney disease Maternal Grandfather    Hypertension Father    Arthritis Paternal Grandfather    Allergies  Allergen Reactions   Hydrocodone     Hallucinations      Review of Systems  Constitutional: Negative.  Negative for chills, fever and weight loss.  HENT:  Negative for congestion, nosebleeds, sinus pain and tinnitus.   Eyes: Negative.   Respiratory: Negative.  Negative for hemoptysis, sputum production, shortness of breath and stridor.   Cardiovascular: Negative.  Negative for chest pain, palpitations and PND.  Gastrointestinal: Negative.  Negative for abdominal pain, blood in stool and nausea.  Genitourinary: Negative.  Negative for hematuria and urgency.  Musculoskeletal: Negative.  Negative for back pain and neck pain.  Skin: Negative.   Neurological: Negative.  Negative for sensory change.  Endo/Heme/Allergies: Negative.   Psychiatric/Behavioral: Negative.        Objective:     BP (!) 106/58   Pulse 65   Temp 97.7 F (36.5 C)   Ht 4\' 11"  (1.499 m)   Wt 135 lb 9.6 oz (61.5 kg)   SpO2 100%   BMI 27.39 kg/m  BP Readings from Last 3 Encounters:  06/01/23 (!) 106/58  05/28/22 114/68  11/17/21 100/66   Wt Readings from Last 3 Encounters:  06/01/23 135 lb 9.6 oz (61.5 kg)  05/28/22 146 lb 4 oz (66.3 kg)  11/12/21 154 lb (69.9 kg)      Physical Exam Constitutional:      Appearance: Normal appearance.  Eyes:     Pupils: Pupils are equal, round, and reactive to light.  Cardiovascular:     Rate and Rhythm: Normal rate and regular rhythm.  Pulmonary:     Effort: Pulmonary effort is normal.  Abdominal:     General: Bowel sounds are normal.     Tenderness: There is no abdominal tenderness. There is no right CVA tenderness, left CVA tenderness, guarding or rebound.     Hernia: No hernia is present.  Musculoskeletal:        General: Normal range of motion.     Cervical back: Normal  range of motion.  Skin:    General: Skin is warm.  Neurological:     General: No focal deficit present.     Mental Status: She is alert. Mental status is at baseline.  Psychiatric:        Mood and Affect: Mood normal.        Behavior: Behavior normal.        Thought Content: Thought content normal.  Judgment: Judgment normal.      Results for orders placed or performed in visit on 06/01/23  CBC with Differential  Result Value Ref Range   WBC 5.7 3.4 - 10.8 x10E3/uL   RBC 3.82 3.77 - 5.28 x10E6/uL   Hemoglobin 12.0 11.1 - 15.9 g/dL   Hematocrit 16.1 09.6 - 46.6 %   MCV 95 79 - 97 fL   MCH 31.4 26.6 - 33.0 pg   MCHC 33.0 31.5 - 35.7 g/dL   RDW 04.5 40.9 - 81.1 %   Platelets 320 150 - 450 x10E3/uL   Neutrophils 48 Not Estab. %   Lymphs 44 Not Estab. %   Monocytes 7 Not Estab. %   Eos 1 Not Estab. %   Basos 0 Not Estab. %   Neutrophils Absolute 2.7 1.4 - 7.0 x10E3/uL   Lymphocytes Absolute 2.5 0.7 - 3.1 x10E3/uL   Monocytes Absolute 0.4 0.1 - 0.9 x10E3/uL   EOS (ABSOLUTE) 0.1 0.0 - 0.4 x10E3/uL   Basophils Absolute 0.0 0.0 - 0.2 x10E3/uL   Immature Granulocytes 0 Not Estab. %   Immature Grans (Abs) 0.0 0.0 - 0.1 x10E3/uL  CMP and Liver  Result Value Ref Range   Glucose 98 70 - 99 mg/dL   BUN 15 6 - 24 mg/dL   Creatinine, Ser 9.14 0.57 - 1.00 mg/dL   eGFR 87 >78 GN/FAO/1.30   Sodium 137 134 - 144 mmol/L   Potassium 4.1 3.5 - 5.2 mmol/L   Chloride 102 96 - 106 mmol/L   CO2 21 20 - 29 mmol/L   Calcium 9.3 8.7 - 10.2 mg/dL   Total Protein 6.9 6.0 - 8.5 g/dL   Albumin 4.1 3.9 - 4.9 g/dL   Bilirubin Total 0.3 0.0 - 1.2 mg/dL   Bilirubin, Direct <8.65 0.00 - 0.40 mg/dL   Alkaline Phosphatase 66 44 - 121 IU/L   AST 18 0 - 40 IU/L   ALT 16 0 - 32 IU/L  Thyroid Panel With TSH  Result Value Ref Range   TSH 1.750 0.450 - 4.500 uIU/mL   T4, Total 6.7 4.5 - 12.0 ug/dL   T3 Uptake Ratio 22 (L) 24 - 39 %   Free Thyroxine Index 1.5 1.2 - 4.9  Hemoglobin A1c  Result  Value Ref Range   Hgb A1c MFr Bld 5.5 4.8 - 5.6 %   Est. average glucose Bld gHb Est-mCnc 111 mg/dL  Lipid Panel  Result Value Ref Range   Cholesterol, Total 148 100 - 199 mg/dL   Triglycerides 784 0 - 149 mg/dL   HDL 60 >69 mg/dL   VLDL Cholesterol Cal 22 5 - 40 mg/dL   LDL Chol Calc (NIH) 66 0 - 99 mg/dL   Chol/HDL Ratio 2.5 0.0 - 4.4 ratio  Hepatitis C Antibody  Result Value Ref Range   Hep C Virus Ab Non Reactive Non Reactive  Vitamin D, 25-hydroxy  Result Value Ref Range   Vit D, 25-Hydroxy 50.5 30.0 - 100.0 ng/mL  Urinalysis Dipstick  Result Value Ref Range   Color, UA yellow    Clarity, UA clear    Glucose, UA Negative Negative   Bilirubin, UA n    Ketones, UA n    Spec Grav, UA 1.020 1.010 - 1.025   Blood, UA small    pH, UA 6.5 5.0 - 8.0   Protein, UA Negative Negative   Urobilinogen, UA 1.0 0.2 or 1.0 E.U./dL   Nitrite, UA n    Leukocytes, UA Negative  Negative   Appearance     Odor      Last CBC Lab Results  Component Value Date   WBC 5.7 06/01/2023   HGB 12.0 06/01/2023   HCT 36.4 06/01/2023   MCV 95 06/01/2023   MCH 31.4 06/01/2023   RDW 11.9 06/01/2023   PLT 320 06/01/2023   Last metabolic panel Lab Results  Component Value Date   GLUCOSE 98 06/01/2023   NA 137 06/01/2023   K 4.1 06/01/2023   CL 102 06/01/2023   CO2 21 06/01/2023   BUN 15 06/01/2023   CREATININE 0.85 06/01/2023   EGFR 87 06/01/2023   CALCIUM 9.3 06/01/2023   PROT 6.9 06/01/2023   ALBUMIN 4.1 06/01/2023   LABGLOB 2.9 05/28/2022   AGRATIO 1.3 05/28/2022   BILITOT 0.3 06/01/2023   ALKPHOS 66 06/01/2023   AST 18 06/01/2023   ALT 16 06/01/2023   Last lipids Lab Results  Component Value Date   CHOL 148 06/01/2023   HDL 60 06/01/2023   LDLCALC 66 06/01/2023   TRIG 124 06/01/2023   CHOLHDL 2.5 06/01/2023   Last hemoglobin A1c Lab Results  Component Value Date   HGBA1C 5.5 06/01/2023   Last thyroid functions Lab Results  Component Value Date   TSH 1.750  06/01/2023   T4TOTAL 6.7 06/01/2023   Last vitamin D Lab Results  Component Value Date   VD25OH 50.5 06/01/2023   Last vitamin B12 and Folate No results found for: "VITAMINB12", "FOLATE"    The 10-year ASCVD risk score (Arnett DK, et al., 2019) is: 0.2%    Assessment & Plan:   Problem List Items Addressed This Visit   None Visit Diagnoses     Healthy adult on routine physical examination    -  Primary   Relevant Orders   CBC with Differential (Completed)   CMP and Liver (Completed)   Thyroid Panel With TSH (Completed)   Hemoglobin A1c (Completed)   Lipid Panel (Completed)   Vitamin D, 25-hydroxy (Completed)   Urinalysis Dipstick (Completed)   Colon cancer screening       Relevant Orders   Ambulatory referral to Gastroenterology   Need for hepatitis C screening test       Relevant Orders   Hepatitis C Antibody (Completed)     Patient will return in 1 year for annual physical or as needed. She will follow-up with her gynecologist for yearly mammogram and Pap smear Patient will receive colonoscopy at 44 Recommend dental visits 2 times per year   Return in about 1 year (around 05/31/2024).   Nolon Nations  APRN, MSN, FNP-C Patient Care [facility] [address] [place], [place] [address] [phone]

## 2023-06-02 LAB — HEPATITIS C ANTIBODY: Hep C Virus Ab: NONREACTIVE

## 2023-06-02 LAB — CBC WITH DIFFERENTIAL/PLATELET
Basophils Absolute: 0 10*3/uL (ref 0.0–0.2)
Basos: 0 %
EOS (ABSOLUTE): 0.1 10*3/uL (ref 0.0–0.4)
Eos: 1 %
Hematocrit: 36.4 % (ref 34.0–46.6)
Hemoglobin: 12 g/dL (ref 11.1–15.9)
Immature Grans (Abs): 0 10*3/uL (ref 0.0–0.1)
Immature Granulocytes: 0 %
Lymphocytes Absolute: 2.5 10*3/uL (ref 0.7–3.1)
Lymphs: 44 %
MCH: 31.4 pg (ref 26.6–33.0)
MCHC: 33 g/dL (ref 31.5–35.7)
MCV: 95 fL (ref 79–97)
Monocytes Absolute: 0.4 10*3/uL (ref 0.1–0.9)
Monocytes: 7 %
Neutrophils Absolute: 2.7 10*3/uL (ref 1.4–7.0)
Neutrophils: 48 %
Platelets: 320 10*3/uL (ref 150–450)
RBC: 3.82 x10E6/uL (ref 3.77–5.28)
RDW: 11.9 % (ref 11.7–15.4)
WBC: 5.7 10*3/uL (ref 3.4–10.8)

## 2023-06-02 LAB — LIPID PANEL
Chol/HDL Ratio: 2.5 ratio (ref 0.0–4.4)
Cholesterol, Total: 148 mg/dL (ref 100–199)
HDL: 60 mg/dL (ref >39)
LDL Chol Calc (NIH): 66 mg/dL (ref 0–99)
Triglycerides: 124 mg/dL (ref 0–149)
VLDL Cholesterol Cal: 22 mg/dL (ref 5–40)

## 2023-06-02 LAB — CMP AND LIVER
ALT: 16 IU/L (ref 0–32)
AST: 18 IU/L (ref 0–40)
Albumin: 4.1 g/dL (ref 3.9–4.9)
Alkaline Phosphatase: 66 IU/L (ref 44–121)
BUN: 15 mg/dL (ref 6–24)
Bilirubin Total: 0.3 mg/dL (ref 0.0–1.2)
Bilirubin, Direct: <0.1 mg/dL (ref 0.00–0.40)
CO2: 21 mmol/L (ref 20–29)
Calcium: 9.3 mg/dL (ref 8.7–10.2)
Chloride: 102 mmol/L (ref 96–106)
Creatinine, Ser: 0.85 mg/dL (ref 0.57–1.00)
Glucose: 98 mg/dL (ref 70–99)
Potassium: 4.1 mmol/L (ref 3.5–5.2)
Sodium: 137 mmol/L (ref 134–144)
Total Protein: 6.9 g/dL (ref 6.0–8.5)
eGFR: 87 mL/min/{1.73_m2} (ref >59)

## 2023-06-02 LAB — HEMOGLOBIN A1C
Est. average glucose Bld gHb Est-mCnc: 111 mg/dL
Hgb A1c MFr Bld: 5.5 % (ref 4.8–5.6)

## 2023-06-02 LAB — VITAMIN D 25 HYDROXY (VIT D DEFICIENCY, FRACTURES): Vit D, 25-Hydroxy: 50.5 ng/mL (ref 30.0–100.0)

## 2023-06-02 LAB — THYROID PANEL WITH TSH
Free Thyroxine Index: 1.5 (ref 1.2–4.9)
T3 Uptake Ratio: 22 % — ABNORMAL LOW (ref 24–39)
T4, Total: 6.7 ug/dL (ref 4.5–12.0)
TSH: 1.75 u[IU]/mL (ref 0.450–4.500)

## 2023-06-06 ENCOUNTER — Encounter: Payer: Self-pay | Admitting: Family Medicine

## 2023-06-23 NOTE — Progress Notes (Signed)
Reviewed patient's labs.  Unremarkable.  Patient will return in 1 year for complete physical exam.  Recommend that patient continues balanced diet and exercise routine.  Also, increase daily water intake.  Follow-up yearly for mammogram.  Recommend dental visits twice yearly.  Routine eye examination yearly.  Pap smear every 5 years if there is no history of HPV.  Nolon Nations  APRN, MSN, FNP-C Patient Care [facility] [address] [place], [place] [address] [phone]

## 2023-08-03 ENCOUNTER — Other Ambulatory Visit: Payer: Self-pay

## 2023-09-23 DIAGNOSIS — F4310 Post-traumatic stress disorder, unspecified: Secondary | ICD-10-CM | POA: Diagnosis not present

## 2023-10-28 ENCOUNTER — Other Ambulatory Visit (HOSPITAL_COMMUNITY): Payer: Self-pay

## 2024-01-18 ENCOUNTER — Other Ambulatory Visit (HOSPITAL_COMMUNITY): Payer: Self-pay

## 2024-01-22 ENCOUNTER — Other Ambulatory Visit (HOSPITAL_COMMUNITY): Payer: Self-pay

## 2024-02-15 DIAGNOSIS — Z630 Problems in relationship with spouse or partner: Secondary | ICD-10-CM | POA: Diagnosis not present

## 2024-02-15 DIAGNOSIS — Z596 Low income: Secondary | ICD-10-CM | POA: Diagnosis not present

## 2024-02-15 DIAGNOSIS — F4323 Adjustment disorder with mixed anxiety and depressed mood: Secondary | ICD-10-CM | POA: Diagnosis not present

## 2024-02-23 DIAGNOSIS — Z630 Problems in relationship with spouse or partner: Secondary | ICD-10-CM | POA: Diagnosis not present

## 2024-02-23 DIAGNOSIS — F4323 Adjustment disorder with mixed anxiety and depressed mood: Secondary | ICD-10-CM | POA: Diagnosis not present

## 2024-02-23 DIAGNOSIS — Z596 Low income: Secondary | ICD-10-CM | POA: Diagnosis not present

## 2024-04-11 ENCOUNTER — Other Ambulatory Visit (HOSPITAL_COMMUNITY): Payer: Self-pay

## 2024-04-11 MED ORDER — NORETHIN ACE-ETH ESTRAD-FE 1-20 MG-MCG PO TABS
1.0000 | ORAL_TABLET | Freq: Every day | ORAL | 0 refills | Status: DC
  Filled 2024-04-11: qty 84, 84d supply, fill #0

## 2024-05-03 ENCOUNTER — Other Ambulatory Visit (HOSPITAL_COMMUNITY): Payer: Self-pay

## 2024-05-03 DIAGNOSIS — Z01419 Encounter for gynecological examination (general) (routine) without abnormal findings: Secondary | ICD-10-CM | POA: Diagnosis not present

## 2024-05-03 DIAGNOSIS — Z124 Encounter for screening for malignant neoplasm of cervix: Secondary | ICD-10-CM | POA: Diagnosis not present

## 2024-05-03 DIAGNOSIS — Z1231 Encounter for screening mammogram for malignant neoplasm of breast: Secondary | ICD-10-CM | POA: Diagnosis not present

## 2024-05-03 DIAGNOSIS — Z1331 Encounter for screening for depression: Secondary | ICD-10-CM | POA: Diagnosis not present

## 2024-05-03 DIAGNOSIS — Z113 Encounter for screening for infections with a predominantly sexual mode of transmission: Secondary | ICD-10-CM | POA: Diagnosis not present

## 2024-05-03 DIAGNOSIS — Z01411 Encounter for gynecological examination (general) (routine) with abnormal findings: Secondary | ICD-10-CM | POA: Diagnosis not present

## 2024-05-03 MED ORDER — NORETHIN ACE-ETH ESTRAD-FE 1-20 MG-MCG PO TABS
1.0000 | ORAL_TABLET | Freq: Every day | ORAL | 3 refills | Status: AC
  Filled 2024-07-06: qty 84, 84d supply, fill #0
  Filled 2024-09-27 (×2): qty 84, 84d supply, fill #1
  Filled 2024-12-19: qty 28, 28d supply, fill #2
  Filled 2025-01-18: qty 28, 28d supply, fill #3

## 2024-05-30 ENCOUNTER — Other Ambulatory Visit (HOSPITAL_COMMUNITY): Payer: Self-pay

## 2024-05-30 ENCOUNTER — Ambulatory Visit: Payer: Self-pay | Admitting: Family Medicine

## 2024-05-30 ENCOUNTER — Ambulatory Visit: Payer: Self-pay | Admitting: Nurse Practitioner

## 2024-05-30 ENCOUNTER — Encounter: Payer: Self-pay | Admitting: Nurse Practitioner

## 2024-05-30 VITALS — BP 100/60 | HR 81 | Temp 97.3°F | Ht 59.0 in | Wt 134.0 lb

## 2024-05-30 DIAGNOSIS — Z1321 Encounter for screening for nutritional disorder: Secondary | ICD-10-CM

## 2024-05-30 DIAGNOSIS — Z1329 Encounter for screening for other suspected endocrine disorder: Secondary | ICD-10-CM | POA: Diagnosis not present

## 2024-05-30 DIAGNOSIS — Z130 Encounter for screening for diseases of the blood and blood-forming organs and certain disorders involving the immune mechanism: Secondary | ICD-10-CM

## 2024-05-30 DIAGNOSIS — E559 Vitamin D deficiency, unspecified: Secondary | ICD-10-CM | POA: Diagnosis not present

## 2024-05-30 DIAGNOSIS — Z13228 Encounter for screening for other metabolic disorders: Secondary | ICD-10-CM | POA: Diagnosis not present

## 2024-05-30 DIAGNOSIS — Z0000 Encounter for general adult medical examination without abnormal findings: Secondary | ICD-10-CM

## 2024-05-30 DIAGNOSIS — L816 Other disorders of diminished melanin formation: Secondary | ICD-10-CM | POA: Diagnosis not present

## 2024-05-30 MED ORDER — TRIAMCINOLONE ACETONIDE 0.025 % EX OINT
1.0000 | TOPICAL_OINTMENT | Freq: Two times a day (BID) | CUTANEOUS | 0 refills | Status: AC
Start: 2024-05-30 — End: ?
  Filled 2024-05-30: qty 30, 15d supply, fill #0

## 2024-05-30 NOTE — Progress Notes (Addendum)
 Complete physical exam  Patient: Ana Davis   DOB: 06/05/1981   43 y.o. Female  MRN: [id number]  Subjective:     No chief complaint on file.   [name] is a 43 y.o. female  has a past medical history of Anxiety, Asthma, Breech presentation (06/13/2017), Eczema, Fibroid, GDM, class A1 (06/13/2017), Genital warts (2013), Gestational diabetes, IBS (irritable bowel syndrome), and Vaginal Pap smear, abnormal. who presents today for a complete physical exam. She reports consuming a general diet.  Does Does cardio and weight training exercises She generally feels well. She reports sleeping fairly, works night shift and gets about 6 hours of sleep daily  Patient complains of skin hypopigmentation and itching that started 3 years ago, initially started on one  leg and then moved to reportedly she would like a referral to dermatologist   Had Tdap vaccine 3 years ago ,up-to-date with mammogram and cervical cancer screening done by her OB/GYN in May 2025.    Most recent fall risk assessment:    05/28/2022   10:05 AM  Fall Risk   Falls in the past year? 0  Number falls in past yr: 0  Injury with Fall? 0  Risk for fall due to : No Fall Risks  Follow up Falls evaluation completed     Most recent depression screenings:    05/30/2024    8:53 AM 06/01/2023   11:40 AM  PHQ 2/9 Scores  PHQ - 2 Score 0 0        Patient Care Team: Koa Palla R, FNP as PCP - General (Nurse Practitioner)   Outpatient Medications Prior to Visit  Medication Sig Note   cetirizine (ZYRTEC) 10 MG tablet Take 10 mg by mouth daily as needed for allergies.     cholecalciferol (VITAMIN D3) 25 MCG (1000 UNIT) tablet Take 1,000 Units by mouth daily. 05/30/2024: 2000 units daily    norethindrone -ethinyl estradiol -FE (TARINA  FE 1/20 EQ) 1-20 MG-MCG tablet Take 1 tablet by mouth daily.    [DISCONTINUED] ibuprofen  (ADVIL ) 600 MG tablet Take 1 tablet (600 mg total) by mouth every 6 (six) hours.  (Patient not taking: Reported on 05/30/2024)    [DISCONTINUED] Iron , Ferrous Sulfate , 325 (65 Fe) MG TABS Take 1 tablet by mouth daily. (Patient not taking: Reported on 05/30/2024)    [DISCONTINUED] norethindrone  (ORTHO MICRONOR ) 0.35 MG tablet Take 1 tablet by mouth every day (Patient not taking: Reported on 05/30/2024)    [DISCONTINUED] oxyCODONE  (OXY IR/ROXICODONE ) 5 MG immediate release tablet Take 1-2 tablets (5-10 mg total) by mouth every 4 (four) hours as needed for moderate pain. (Patient not taking: Reported on 05/30/2024)    No facility-administered medications prior to visit.    Review of Systems  Constitutional:  Negative for appetite change, chills, fatigue and fever.  HENT:  Negative for congestion, postnasal drip, rhinorrhea and sneezing.   Eyes:  Negative for pain, discharge and itching.  Respiratory:  Negative for cough, shortness of breath and wheezing.   Cardiovascular:  Negative for chest pain, palpitations and leg swelling.  Gastrointestinal:  Negative for abdominal pain, constipation, nausea and vomiting.  Endocrine: Negative for cold intolerance, heat intolerance and polydipsia.  Genitourinary:  Negative for difficulty urinating, dysuria, flank pain and frequency.  Musculoskeletal:  Negative for arthralgias, back pain, joint swelling and myalgias.  Skin:  Positive for color change. Negative for pallor, rash and wound.  Allergic/Immunologic: Negative for environmental allergies, food allergies and immunocompromised state.  Neurological:  Negative for dizziness, facial  asymmetry, weakness, numbness and headaches.  Psychiatric/Behavioral:  Negative for behavioral problems, confusion, self-injury and suicidal ideas.        Objective:     BP 100/60   Pulse 81   Temp (!) 97.3 F (36.3 C)   Ht 4\' 11"  (1.499 m)   Wt 134 lb (60.8 kg)   SpO2 99%   BMI 27.06 kg/m    Physical Exam Vitals and nursing note reviewed.  Constitutional:      General: She is not in acute  distress.    Appearance: Normal appearance. She is not ill-appearing, toxic-appearing or diaphoretic.  HENT:     Right Ear: Tympanic membrane, ear canal and external ear normal. There is no impacted cerumen.     Left Ear: Tympanic membrane, ear canal and external ear normal. There is no impacted cerumen.     Nose: Nose normal. No congestion or rhinorrhea.     Mouth/Throat:     Mouth: Mucous membranes are moist.     Pharynx: Oropharynx is clear. No oropharyngeal exudate or posterior oropharyngeal erythema.  Eyes:     General: No scleral icterus.       Right eye: No discharge.        Left eye: No discharge.     Extraocular Movements: Extraocular movements intact.     Conjunctiva/sclera: Conjunctivae normal.  Neck:     Vascular: No carotid bruit.  Cardiovascular:     Rate and Rhythm: Normal rate and regular rhythm.     Pulses: Normal pulses.     Heart sounds: Normal heart sounds. No murmur heard.    No friction rub. No gallop.  Pulmonary:     Effort: Pulmonary effort is normal. No respiratory distress.     Breath sounds: Normal breath sounds. No stridor. No wheezing, rhonchi or rales.  Chest:     Chest wall: No tenderness.  Abdominal:     General: Bowel sounds are normal. There is no distension.     Palpations: Abdomen is soft. There is no mass.     Tenderness: There is no abdominal tenderness. There is no right CVA tenderness, left CVA tenderness, guarding or rebound.     Hernia: No hernia is present.  Musculoskeletal:        General: No swelling, tenderness, deformity or signs of injury.     Cervical back: Normal range of motion and neck supple. No rigidity or tenderness.     Right lower leg: No edema.     Left lower leg: No edema.  Lymphadenopathy:     Cervical: No cervical adenopathy.  Skin:    General: Skin is warm and dry.     Capillary Refill: Capillary refill takes less than 2 seconds.     Coloration: Skin is not jaundiced or pale.     Findings: No bruising, erythema,  lesion or rash.     Comments: Skin hypopigmentation noted on bilateral lower extremities.  No rashes or redness noted  Neurological:     Mental Status: She is alert and oriented to person, place, and time.     Cranial Nerves: No cranial nerve deficit.     Motor: No weakness.     Gait: Gait normal.  Psychiatric:        Mood and Affect: Mood normal.        Behavior: Behavior normal.        Thought Content: Thought content normal.        Judgment: Judgment normal.  No results found for any visits on 05/30/24.     Assessment & Plan:    Routine Health Maintenance and Physical Exam  Immunization History  Administered Date(s) Administered   Hepatitis A 02/16/2012   Influenza Split 09/18/2014   Influenza,inj,Quad PF,6+ Mos 09/27/2001   Influenza-Unspecified 09/29/2016, 10/03/2021   Typhoid Inactivated 02/16/2012   Yellow Fever 02/16/2012    Health Maintenance  Topic Date Due   DTaP/Tdap/Td (1 - Tdap) Never done   Cervical Cancer Screening (HPV/Pap Cotest)  10/14/2019   COVID-19 Vaccine (1 - 2024-25 season) Never done   INFLUENZA VACCINE  07/28/2024   Hepatitis C Screening  Completed   HIV Screening  Completed   HPV VACCINES  Aged Out   Meningococcal B Vaccine  Aged Out    Discussed health benefits of physical activity, and encouraged her to engage in regular exercise appropriate for her age and condition.  Problem List Items Addressed This Visit       Musculoskeletal and Integument   Skin hypopigmentation    - Ambulatory referral to Dermatology - triamcinolone (KENALOG) 0.025 % ointment; Apply topically 2 (two) times daily.  Dispense: 30 g; Refill: 0        Relevant Medications   triamcinolone (KENALOG) 0.025 % ointment   Other Relevant Orders   Ambulatory referral to Dermatology     Other   Annual physical exam - Primary   Annual exam as documented.  Counseling done include healthy lifestyle involving committing to 150 minutes of exercise per week, heart  healthy diet, Regular use of seat belt and home safety were also discussed . Immunization and cancer screening  needs are specifically addressed at this visit.    Up-to-date with mammogram and cervical cancer screening and Tdap vaccines      Vitamin D  deficiency   Takes vitamin D  2000 units daily Last vitamin D  Lab Results  Component Value Date   VD25OH 50.5 06/01/2023         Relevant Orders   VITAMIN D  25 Hydroxy (Vit-D Deficiency, Fractures)   Other Visit Diagnoses       Screening for endocrine, nutritional, metabolic and immunity disorder       Relevant Orders   CBC   CMP14+EGFR   Hemoglobin A1c   Lipid panel      Return in about 1 year (around 05/30/2025) for FASTING LABS THIS WEEK.     Gurley Climer R Zacherie Honeyman, FNP

## 2024-05-30 NOTE — Assessment & Plan Note (Signed)
 Annual exam as documented.  Counseling done include healthy lifestyle involving committing to 150 minutes of exercise per week, heart healthy diet, Regular use of seat belt and home safety were also discussed . Immunization and cancer screening  needs are specifically addressed at this visit.    Up-to-date with mammogram and cervical cancer screening and Tdap vaccines

## 2024-05-30 NOTE — Assessment & Plan Note (Signed)
-   Ambulatory referral to Dermatology - triamcinolone (KENALOG) 0.025 % ointment; Apply topically 2 (two) times daily.  Dispense: 30 g; Refill: 0

## 2024-05-30 NOTE — Patient Instructions (Signed)

## 2024-05-30 NOTE — Assessment & Plan Note (Signed)
 Takes vitamin D  2000 units daily Last vitamin D  Lab Results  Component Value Date   VD25OH 50.5 06/01/2023

## 2024-05-30 NOTE — Addendum Note (Signed)
 Addended by: Jacquetta Mattocks on: 05/30/2024 07:49 PM   Modules accepted: Orders

## 2024-06-05 ENCOUNTER — Other Ambulatory Visit: Payer: Self-pay

## 2024-06-05 DIAGNOSIS — Z13228 Encounter for screening for other metabolic disorders: Secondary | ICD-10-CM | POA: Diagnosis not present

## 2024-06-05 DIAGNOSIS — E559 Vitamin D deficiency, unspecified: Secondary | ICD-10-CM | POA: Diagnosis not present

## 2024-06-05 DIAGNOSIS — Z130 Encounter for screening for diseases of the blood and blood-forming organs and certain disorders involving the immune mechanism: Secondary | ICD-10-CM

## 2024-06-05 DIAGNOSIS — Z1321 Encounter for screening for nutritional disorder: Secondary | ICD-10-CM | POA: Diagnosis not present

## 2024-06-05 DIAGNOSIS — Z1329 Encounter for screening for other suspected endocrine disorder: Secondary | ICD-10-CM | POA: Diagnosis not present

## 2024-06-06 ENCOUNTER — Ambulatory Visit: Payer: Self-pay | Admitting: Nurse Practitioner

## 2024-06-06 LAB — CMP14+EGFR
ALT: 16 IU/L (ref 0–32)
AST: 15 IU/L (ref 0–40)
Albumin: 4 g/dL (ref 3.9–4.9)
Alkaline Phosphatase: 66 IU/L (ref 44–121)
BUN/Creatinine Ratio: 16 (ref 9–23)
BUN: 13 mg/dL (ref 6–24)
Bilirubin Total: 0.2 mg/dL (ref 0.0–1.2)
CO2: 19 mmol/L — ABNORMAL LOW (ref 20–29)
Calcium: 9 mg/dL (ref 8.7–10.2)
Chloride: 104 mmol/L (ref 96–106)
Creatinine, Ser: 0.82 mg/dL (ref 0.57–1.00)
Globulin, Total: 2.6 g/dL (ref 1.5–4.5)
Glucose: 84 mg/dL (ref 70–99)
Potassium: 4.4 mmol/L (ref 3.5–5.2)
Sodium: 139 mmol/L (ref 134–144)
Total Protein: 6.6 g/dL (ref 6.0–8.5)
eGFR: 90 mL/min/{1.73_m2} (ref >59)

## 2024-06-06 LAB — CBC
Hematocrit: 35.4 % (ref 34.0–46.6)
Hemoglobin: 11.2 g/dL (ref 11.1–15.9)
MCH: 31.4 pg (ref 26.6–33.0)
MCHC: 31.6 g/dL (ref 31.5–35.7)
MCV: 99 fL — ABNORMAL HIGH (ref 79–97)
Platelets: 311 10*3/uL (ref 150–450)
RBC: 3.57 x10E6/uL — ABNORMAL LOW (ref 3.77–5.28)
RDW: 11.8 % (ref 11.7–15.4)
WBC: 5.3 10*3/uL (ref 3.4–10.8)

## 2024-06-06 LAB — HEMOGLOBIN A1C
Est. average glucose Bld gHb Est-mCnc: 105 mg/dL
Hgb A1c MFr Bld: 5.3 % (ref 4.8–5.6)

## 2024-06-06 LAB — LIPID PANEL
Chol/HDL Ratio: 2.6 ratio (ref 0.0–4.4)
Cholesterol, Total: 148 mg/dL (ref 100–199)
HDL: 56 mg/dL (ref >39)
LDL Chol Calc (NIH): 77 mg/dL (ref 0–99)
Triglycerides: 77 mg/dL (ref 0–149)
VLDL Cholesterol Cal: 15 mg/dL (ref 5–40)

## 2024-06-06 LAB — VITAMIN D 25 HYDROXY (VIT D DEFICIENCY, FRACTURES): Vit D, 25-Hydroxy: 54.6 ng/mL (ref 30.0–100.0)

## 2024-06-20 DIAGNOSIS — D251 Intramural leiomyoma of uterus: Secondary | ICD-10-CM | POA: Diagnosis not present

## 2024-07-06 ENCOUNTER — Other Ambulatory Visit (HOSPITAL_COMMUNITY): Payer: Self-pay

## 2024-09-27 ENCOUNTER — Other Ambulatory Visit (HOSPITAL_COMMUNITY): Payer: Self-pay

## 2024-12-19 ENCOUNTER — Other Ambulatory Visit (HOSPITAL_COMMUNITY): Payer: Self-pay

## 2025-01-18 ENCOUNTER — Other Ambulatory Visit (HOSPITAL_COMMUNITY): Payer: Self-pay

## 2025-02-27 ENCOUNTER — Ambulatory Visit: Admitting: Dermatology

## 2025-05-30 ENCOUNTER — Ambulatory Visit: Admitting: Nurse Practitioner

## 2025-05-30 ENCOUNTER — Ambulatory Visit: Payer: Self-pay | Admitting: Nurse Practitioner
